# Patient Record
Sex: Male | Born: 1965 | Race: White | Hispanic: No | Marital: Married | Smoking: Current every day smoker
Health system: Southern US, Community
[De-identification: ages and names within clinical notes are randomized; demographics above are authoritative.]

## PROBLEM LIST (undated history)

## (undated) DIAGNOSIS — F209 Schizophrenia, unspecified: Secondary | ICD-10-CM

## (undated) DIAGNOSIS — F319 Bipolar disorder, unspecified: Secondary | ICD-10-CM

## (undated) DIAGNOSIS — S61202A Unspecified open wound of right middle finger without damage to nail, initial encounter: Secondary | ICD-10-CM

## (undated) DIAGNOSIS — IMO0001 Reserved for inherently not codable concepts without codable children: Secondary | ICD-10-CM

## (undated) DIAGNOSIS — J40 Bronchitis, not specified as acute or chronic: Secondary | ICD-10-CM

## (undated) DIAGNOSIS — F419 Anxiety disorder, unspecified: Secondary | ICD-10-CM

## (undated) DIAGNOSIS — Z87442 Personal history of urinary calculi: Secondary | ICD-10-CM

## (undated) DIAGNOSIS — J449 Chronic obstructive pulmonary disease, unspecified: Secondary | ICD-10-CM

## (undated) DIAGNOSIS — F32A Depression, unspecified: Secondary | ICD-10-CM

## (undated) DIAGNOSIS — F6381 Intermittent explosive disorder: Secondary | ICD-10-CM

## (undated) DIAGNOSIS — F329 Major depressive disorder, single episode, unspecified: Secondary | ICD-10-CM

## (undated) HISTORY — PX: BUNIONECTOMY: SHX129

## (undated) HISTORY — PX: VASECTOMY: SHX75

## (undated) HISTORY — PX: SHOULDER SURGERY: SHX246

---

## 2003-09-11 ENCOUNTER — Emergency Department (HOSPITAL_COMMUNITY): Admission: EM | Admit: 2003-09-11 | Discharge: 2003-09-11 | Payer: Self-pay | Admitting: Emergency Medicine

## 2004-01-23 ENCOUNTER — Emergency Department (HOSPITAL_COMMUNITY): Admission: EM | Admit: 2004-01-23 | Discharge: 2004-01-23 | Payer: Self-pay | Admitting: Emergency Medicine

## 2004-07-30 ENCOUNTER — Ambulatory Visit (HOSPITAL_BASED_OUTPATIENT_CLINIC_OR_DEPARTMENT_OTHER): Admission: RE | Admit: 2004-07-30 | Discharge: 2004-07-30 | Payer: Self-pay | Admitting: Orthopedic Surgery

## 2008-02-01 ENCOUNTER — Emergency Department (HOSPITAL_COMMUNITY): Admission: EM | Admit: 2008-02-01 | Discharge: 2008-02-02 | Payer: Self-pay | Admitting: Emergency Medicine

## 2009-02-16 ENCOUNTER — Emergency Department (HOSPITAL_COMMUNITY): Admission: EM | Admit: 2009-02-16 | Discharge: 2009-02-17 | Payer: Self-pay | Admitting: Emergency Medicine

## 2009-10-04 ENCOUNTER — Emergency Department (HOSPITAL_COMMUNITY): Admission: EM | Admit: 2009-10-04 | Discharge: 2009-10-04 | Payer: Self-pay | Admitting: Emergency Medicine

## 2010-01-30 ENCOUNTER — Emergency Department (HOSPITAL_COMMUNITY): Admission: EM | Admit: 2010-01-30 | Discharge: 2010-01-30 | Payer: Self-pay | Admitting: Emergency Medicine

## 2010-01-30 IMAGING — CR DG ELBOW COMPLETE 3+V*R*
4 series · 4 of 4 positions shown · non-contrast
Comparison: None.

CLINICAL DATA: Right elbow pain and tingling

RIGHT ELBOW - COMPLETE 3+ VIEW

[view not recorded (1 of 4)]
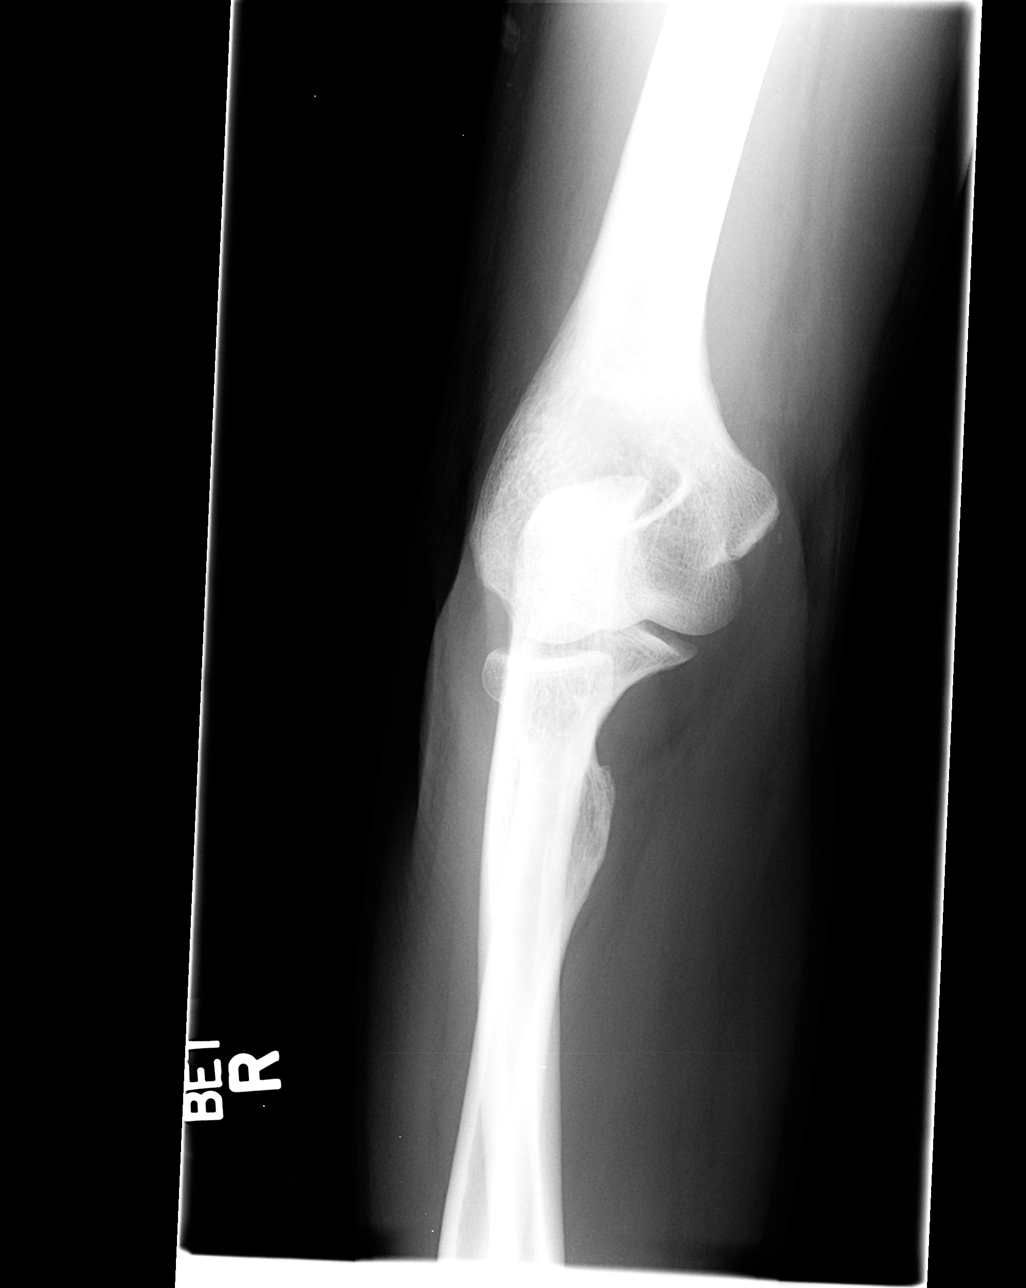

[view not recorded (2 of 4)]
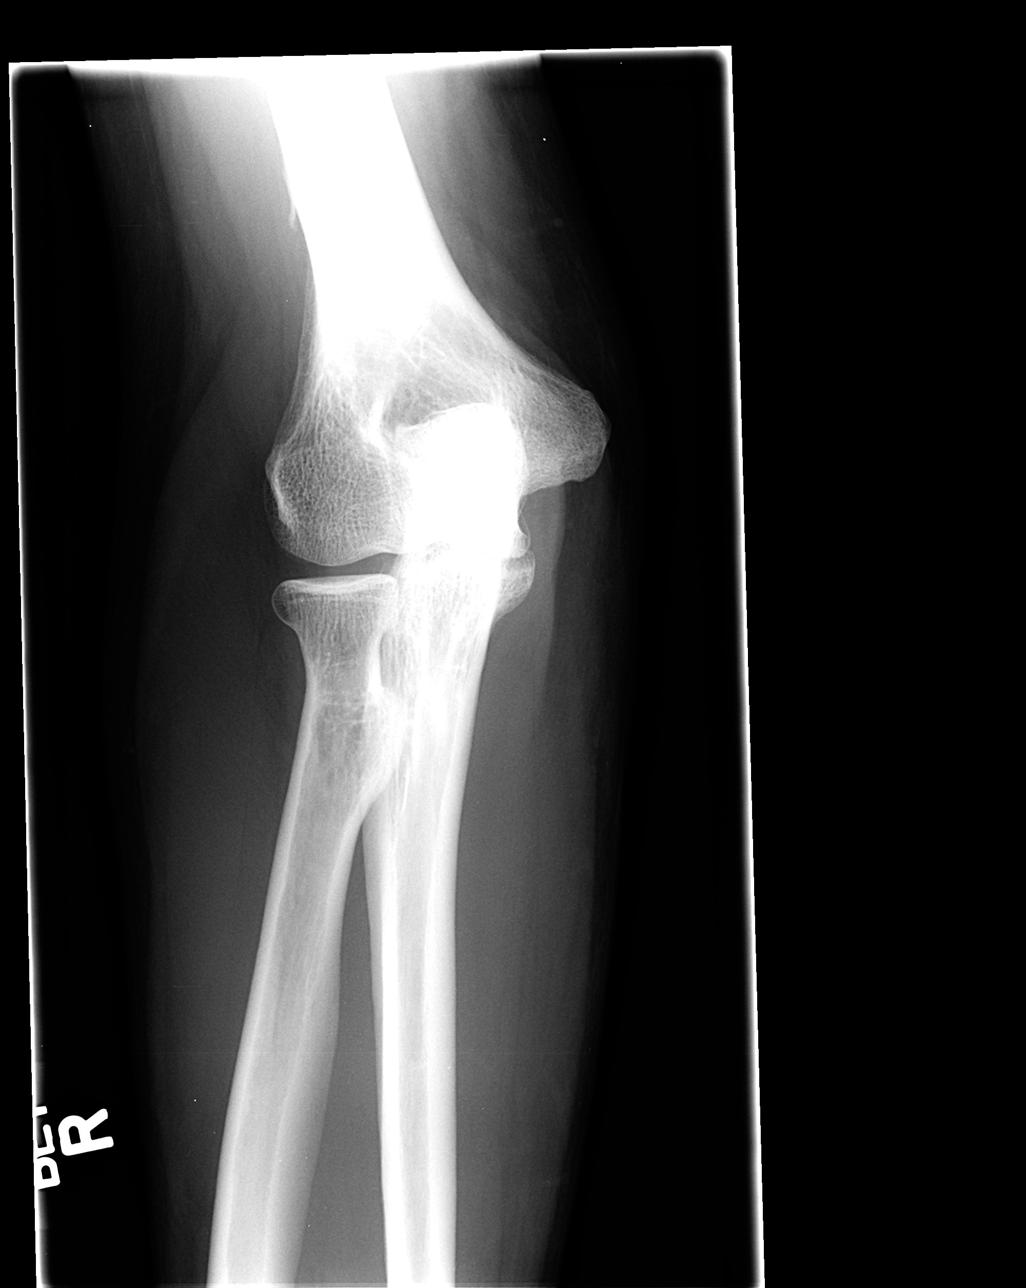

[view not recorded (3 of 4)]
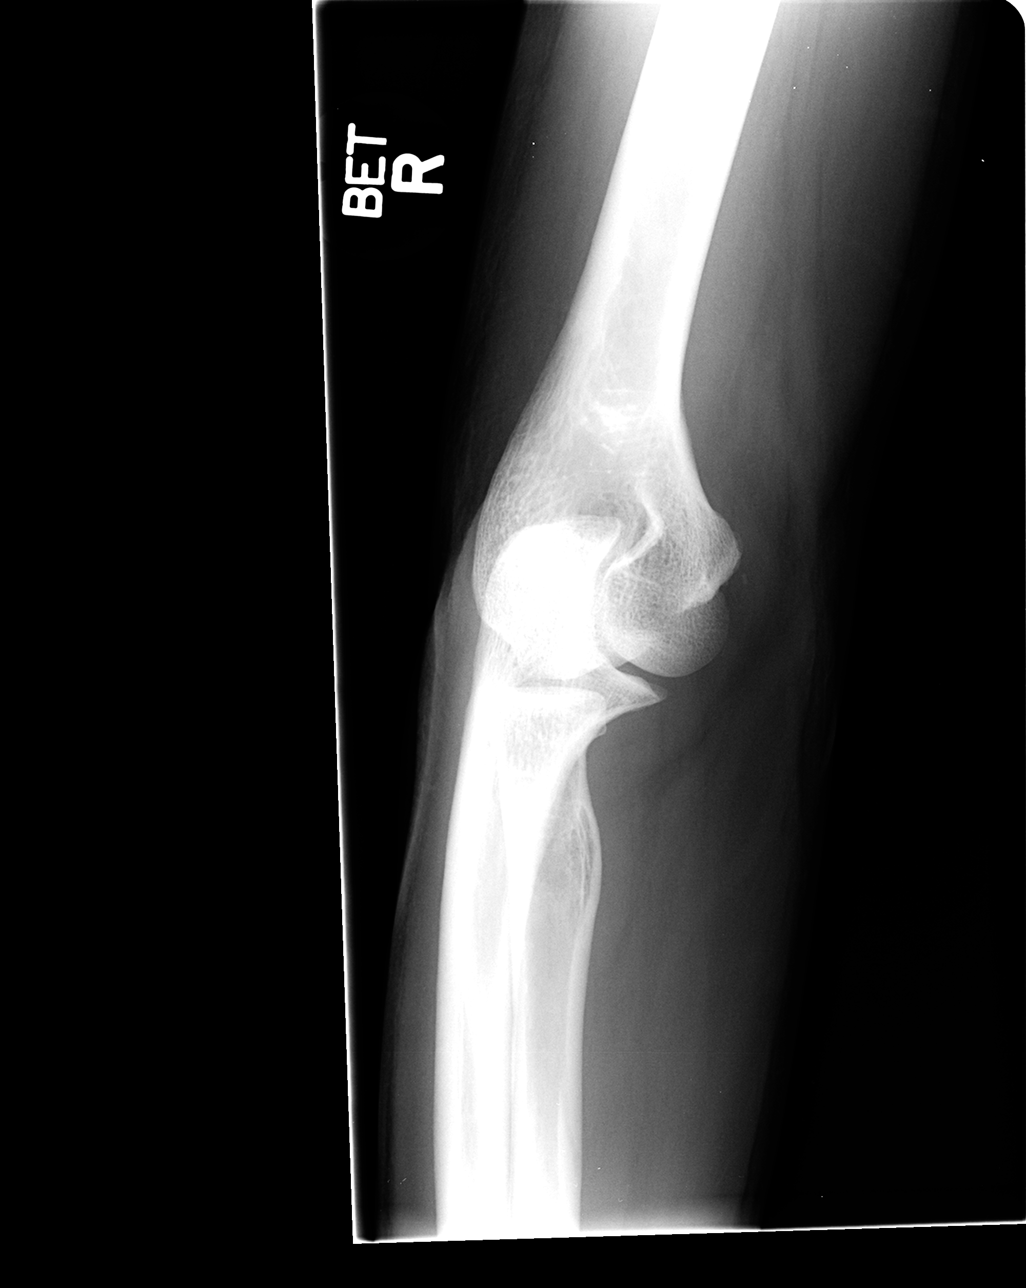

[view not recorded (4 of 4)]
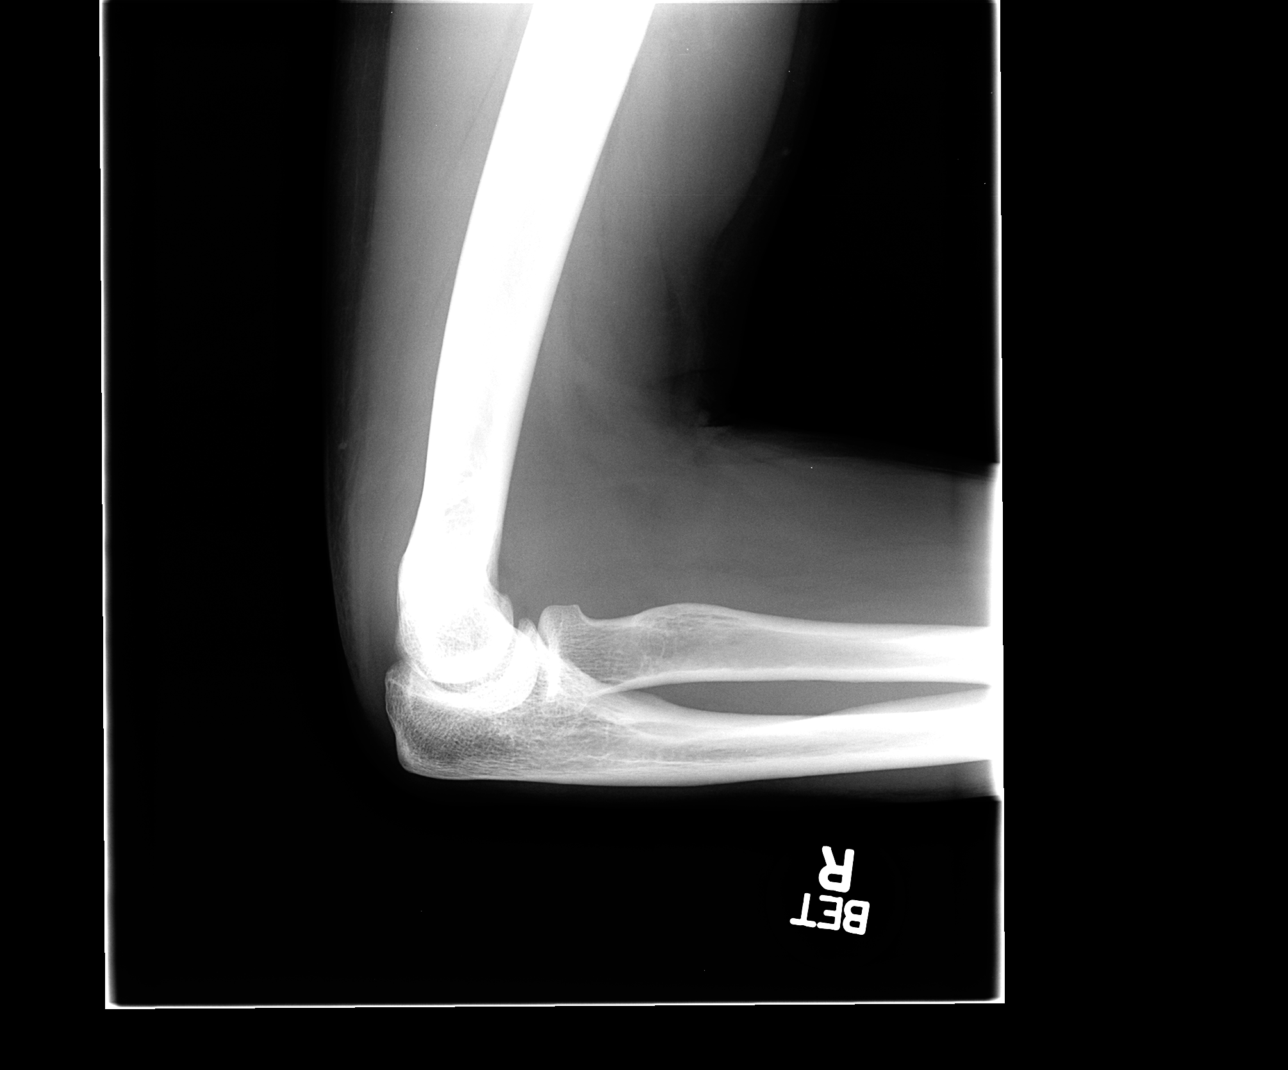

[4 of 4 positions shown; findings below may reference images not displayed]

FINDINGS: No fracture or dislocation.  No soft tissue abnormality.
No radiopaque foreign body.
IMPRESSION: Normal exam.

## 2010-03-17 ENCOUNTER — Emergency Department (HOSPITAL_COMMUNITY): Admission: EM | Admit: 2010-03-17 | Discharge: 2010-03-18 | Payer: Self-pay | Admitting: Emergency Medicine

## 2010-04-08 ENCOUNTER — Emergency Department (HOSPITAL_COMMUNITY): Admission: EM | Admit: 2010-04-08 | Discharge: 2010-04-08 | Payer: Self-pay | Admitting: Emergency Medicine

## 2010-09-20 ENCOUNTER — Emergency Department (HOSPITAL_COMMUNITY)
Admission: EM | Admit: 2010-09-20 | Discharge: 2010-09-20 | Disposition: A | Discharge status: Home / Self Care | Payer: Self-pay | Attending: Emergency Medicine | Admitting: Emergency Medicine

## 2010-09-20 DIAGNOSIS — J449 Chronic obstructive pulmonary disease, unspecified: Secondary | ICD-10-CM | POA: Insufficient documentation

## 2010-09-20 DIAGNOSIS — F209 Schizophrenia, unspecified: Secondary | ICD-10-CM | POA: Insufficient documentation

## 2010-09-20 DIAGNOSIS — M545 Low back pain, unspecified: Secondary | ICD-10-CM | POA: Insufficient documentation

## 2010-09-20 DIAGNOSIS — R296 Repeated falls: Secondary | ICD-10-CM | POA: Insufficient documentation

## 2010-09-20 DIAGNOSIS — IMO0001 Reserved for inherently not codable concepts without codable children: Secondary | ICD-10-CM | POA: Insufficient documentation

## 2010-09-20 DIAGNOSIS — S335XXA Sprain of ligaments of lumbar spine, initial encounter: Secondary | ICD-10-CM | POA: Insufficient documentation

## 2010-09-20 DIAGNOSIS — F319 Bipolar disorder, unspecified: Secondary | ICD-10-CM | POA: Insufficient documentation

## 2010-09-20 DIAGNOSIS — IMO0002 Reserved for concepts with insufficient information to code with codable children: Secondary | ICD-10-CM | POA: Insufficient documentation

## 2010-09-20 DIAGNOSIS — Y92009 Unspecified place in unspecified non-institutional (private) residence as the place of occurrence of the external cause: Secondary | ICD-10-CM | POA: Insufficient documentation

## 2010-09-20 DIAGNOSIS — J4489 Other specified chronic obstructive pulmonary disease: Secondary | ICD-10-CM | POA: Insufficient documentation

## 2010-10-07 LAB — BASIC METABOLIC PANEL
BUN: 15 mg/dL (ref 6–23)
Chloride: 109 mEq/L (ref 96–112)
Glucose, Bld: 98 mg/dL (ref 70–99)
Potassium: 3.4 mEq/L — ABNORMAL LOW (ref 3.5–5.1)

## 2010-10-07 LAB — DIFFERENTIAL
Basophils Absolute: 0.1 10*3/uL (ref 0.0–0.1)
Eosinophils Absolute: 0.4 10*3/uL (ref 0.0–0.7)
Eosinophils Relative: 4 % (ref 0–5)
Lymphs Abs: 2 10*3/uL (ref 0.7–4.0)

## 2010-10-07 LAB — CK TOTAL AND CKMB (NOT AT ARMC): Total CK: 126 U/L (ref 7–232)

## 2010-10-07 LAB — CBC
HCT: 36.8 % — ABNORMAL LOW (ref 39.0–52.0)
MCV: 89.3 fL (ref 78.0–100.0)
Platelets: 221 10*3/uL (ref 150–400)
RDW: 13.6 % (ref 11.5–15.5)
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
## 2011-04-12 LAB — URINALYSIS, ROUTINE W REFLEX MICROSCOPIC
Glucose, UA: NEGATIVE
Ketones, ur: NEGATIVE
Leukocytes, UA: NEGATIVE
Specific Gravity, Urine: 1.025
pH: 5.5

## 2011-04-12 LAB — URINE MICROSCOPIC-ADD ON

## 2011-06-06 ENCOUNTER — Encounter: Payer: Self-pay | Admitting: Emergency Medicine

## 2011-06-06 DIAGNOSIS — M79609 Pain in unspecified limb: Secondary | ICD-10-CM | POA: Insufficient documentation

## 2011-06-06 DIAGNOSIS — S90129A Contusion of unspecified lesser toe(s) without damage to nail, initial encounter: Secondary | ICD-10-CM | POA: Insufficient documentation

## 2011-06-06 DIAGNOSIS — M7989 Other specified soft tissue disorders: Secondary | ICD-10-CM | POA: Insufficient documentation

## 2011-06-06 DIAGNOSIS — Y92009 Unspecified place in unspecified non-institutional (private) residence as the place of occurrence of the external cause: Secondary | ICD-10-CM | POA: Insufficient documentation

## 2011-06-06 DIAGNOSIS — W2203XA Walked into furniture, initial encounter: Secondary | ICD-10-CM | POA: Insufficient documentation

## 2011-06-06 NOTE — ED Notes (Signed)
Patient states was walking in the kitchen and hit his left little toe on a chair.  States toe is now bruised and swollen.

## 2011-06-07 ENCOUNTER — Emergency Department (HOSPITAL_COMMUNITY)
Admit: 2011-06-07 | Discharge: 2011-06-07 | Disposition: A | Discharge status: Home / Self Care | Payer: Self-pay | Attending: Emergency Medicine | Admitting: Emergency Medicine

## 2011-06-07 ENCOUNTER — Emergency Department (HOSPITAL_COMMUNITY)
Admission: EM | Admit: 2011-06-07 | Discharge: 2011-06-07 | Disposition: A | Discharge status: Home / Self Care | Payer: Self-pay | Attending: Emergency Medicine | Admitting: Emergency Medicine

## 2011-06-07 DIAGNOSIS — S90122A Contusion of left lesser toe(s) without damage to nail, initial encounter: Secondary | ICD-10-CM

## 2011-06-07 HISTORY — DX: Schizophrenia, unspecified: F20.9

## 2011-06-07 HISTORY — DX: Depression, unspecified: F32.A

## 2011-06-07 HISTORY — DX: Major depressive disorder, single episode, unspecified: F32.9

## 2011-06-07 HISTORY — DX: Bipolar disorder, unspecified: F31.9

## 2011-06-07 HISTORY — DX: Intermittent explosive disorder: F63.81

## 2011-06-07 MED ORDER — OXYCODONE-ACETAMINOPHEN 5-325 MG PO TABS
2.0000 | ORAL_TABLET | Freq: Once | ORAL | Status: AC
  Administered 2011-06-07: 2 via ORAL
  Filled 2011-06-07: qty 2

## 2011-06-07 MED ORDER — OXYCODONE-ACETAMINOPHEN 5-325 MG PO TABS: 1.0000 | ORAL_TABLET | ORAL | Status: DC | PRN

## 2011-06-07 NOTE — ED Provider Notes (Signed)
History     CSN: [id number] Arrival date & time: 06/07/2011 12:45 AM   First MD Initiated Contact with Patient 06/07/11 [phone]      Chief Complaint  Patient presents with  . Toe Pain    (Consider location/radiation/quality/duration/timing/severity/associated sxs/prior treatment) HPI Comments: Approximately 2 hours prior to arrival the patient struck his left small toe on a chair while walking. The pain was acute in onset, constant, associated with bruising and swelling.  Patient is a 45 y.o. male presenting with toe pain. The history is provided by the patient.  Toe Pain This is a new problem. The current episode started 1 to 2 hours ago. The problem occurs constantly. The problem has not changed since onset.Exacerbated by: Walking, palpation. Relieved by: Rest. He has tried nothing for the symptoms.    Past Medical History  Diagnosis Date  . Bipolar 1 disorder   . Intermittent explosive disorder   . Schizophrenia   . Depression     Past Surgical History  Procedure Date  . Bunionectomy   . Shoulder surgery     No family history on file.  History  Substance Use Topics  . Smoking status: Current Everyday Smoker -- 2.0 packs/day    Types: Cigarettes  . Smokeless tobacco: Not on file  . Alcohol Use: No      Review of Systems  Musculoskeletal: Positive for joint swelling.  Skin:       Bruising  Neurological: Negative for numbness.  Hematological: Does not bruise/bleed easily.    Allergies  Aspirin; Ibuprofen; and Tramadol  Home Medications   Current Outpatient Rx  Name Route Sig Dispense Refill  . ALPRAZOLAM 0.5 MG PO TABS Oral Take 0.5 mg by mouth 4 (four) times daily as needed.      Marland Kitchen DIVALPROEX SODIUM 500 MG PO TB24 Oral Take 1,500 mg by mouth daily.      . SERTRALINE HCL 50 MG PO TABS Oral Take 50 mg by mouth daily.      . TRAZODONE HCL 100 MG PO TABS Oral Take 100 mg by mouth at bedtime.      . OXYCODONE-ACETAMINOPHEN 5-325 MG PO TABS Oral Take 1  tablet by mouth every 4 (four) hours as needed for pain. May take 2 tablets PO q 6 hours for severe pain - Do not take with Tylenol as this tablet already contains tylenol 10 tablet 0    BP 127/81  Pulse 67  Temp(Src) 98.4 F (36.9 C) (Oral)  Resp 18  Ht 5\' 11"  (1.803 m)  Wt 185 lb (83.915 kg)  BMI 25.80 kg/m2  SpO2 99%  Physical Exam  Nursing note and vitals reviewed. Constitutional: He appears well-developed and well-nourished. No distress.  HENT:  Head: Normocephalic and atraumatic.  Eyes: Conjunctivae are normal. No scleral icterus.  Cardiovascular: Normal rate, regular rhythm and intact distal pulses.   Pulmonary/Chest: Effort normal and breath sounds normal.  Musculoskeletal: He exhibits tenderness ( Tenderness over the left small toe with associated bruising and swelling. Minimal tenderness over the distal lateral metatarsal on the fifth on the left). He exhibits no edema.  Neurological: He is alert.  Skin: Skin is warm and dry. He is not diaphoretic.       Bruising as described above    ED Course  Procedures (including critical care time)  Labs Reviewed - No data to display Dg Toe 5th Left  06/07/2011  *RADIOLOGY REPORT*  Clinical Data: Injured left fifth toe.  LEFT 5th TOE - 2+  VIEW 06/07/2011:  Comparison: None.  Findings: Fusion of the middle and distal phalanges.  Slight plantar subluxation of the IP joint.  No evidence of acute fracture.  IMPRESSION: No evidence of acute fracture.  Fusion of the middle and distal phalanges.  Slight plantar subluxation of the IP joint.  Original Report Authenticated By: Arnell Sieving, M.D.     1. Contusion of fifth toe, left       MDM  X-ray pending, patient has possible fracture of small toe versus contusion. Not on anticoagulation. Pain medication ordered  X-ray negative, vital signs normal, rice therapy encouraged, Buddy tape given in the emergency department along with Percocet for pain. We'll discharge  home      Vida Roller, MD 06/07/11 [phone]

## 2011-06-11 ENCOUNTER — Emergency Department (HOSPITAL_COMMUNITY): Payer: Self-pay

## 2011-06-11 ENCOUNTER — Encounter (HOSPITAL_COMMUNITY): Payer: Self-pay | Admitting: Emergency Medicine

## 2011-06-11 ENCOUNTER — Emergency Department (HOSPITAL_COMMUNITY)
Admission: EM | Admit: 2011-06-11 | Discharge: 2011-06-11 | Disposition: A | Discharge status: Home / Self Care | Payer: Self-pay | Attending: Emergency Medicine | Admitting: Emergency Medicine

## 2011-06-11 DIAGNOSIS — IMO0002 Reserved for concepts with insufficient information to code with codable children: Secondary | ICD-10-CM | POA: Insufficient documentation

## 2011-06-11 DIAGNOSIS — S139XXA Sprain of joints and ligaments of unspecified parts of neck, initial encounter: Secondary | ICD-10-CM | POA: Insufficient documentation

## 2011-06-11 DIAGNOSIS — S43409A Unspecified sprain of unspecified shoulder joint, initial encounter: Secondary | ICD-10-CM

## 2011-06-11 DIAGNOSIS — Z8659 Personal history of other mental and behavioral disorders: Secondary | ICD-10-CM | POA: Insufficient documentation

## 2011-06-11 DIAGNOSIS — S161XXA Strain of muscle, fascia and tendon at neck level, initial encounter: Secondary | ICD-10-CM

## 2011-06-11 DIAGNOSIS — X500XXA Overexertion from strenuous movement or load, initial encounter: Secondary | ICD-10-CM | POA: Insufficient documentation

## 2011-06-11 DIAGNOSIS — M25519 Pain in unspecified shoulder: Secondary | ICD-10-CM | POA: Insufficient documentation

## 2011-06-11 DIAGNOSIS — M542 Cervicalgia: Secondary | ICD-10-CM | POA: Insufficient documentation

## 2011-06-11 DIAGNOSIS — Z79899 Other long term (current) drug therapy: Secondary | ICD-10-CM | POA: Insufficient documentation

## 2011-06-11 DIAGNOSIS — F3130 Bipolar disorder, current episode depressed, mild or moderate severity, unspecified: Secondary | ICD-10-CM | POA: Insufficient documentation

## 2011-06-11 HISTORY — DX: Bronchitis, not specified as acute or chronic: J40

## 2011-06-11 MED ORDER — HYDROCODONE-ACETAMINOPHEN 5-325 MG PO TABS: 1.0000 | ORAL_TABLET | ORAL | Status: AC | PRN

## 2011-06-11 NOTE — ED Notes (Signed)
PT. REPORTS INJURED RIGHT SHOULDER WITH PAIN AFTER PICKING UP A HEAVY BOX THIS EVENING.

## 2011-06-12 NOTE — ED Provider Notes (Signed)
Evaluation and management procedures were performed by the PA/NP under my supervision/collaboration.   Gaetan Spieker, MD 06/12/11 2257 

## 2011-06-12 NOTE — ED Provider Notes (Signed)
History     CSN: [id number] Arrival date & time: 06/11/2011  6:58 PM   First MD Initiated Contact with Patient 06/11/11 2243      Chief Complaint  Patient presents with  . Shoulder Pain    (Consider location/radiation/quality/duration/timing/severity/associated sxs/prior treatment) HPI History provided by pt.   Pt was pulling something heavy in the garage today, felt a pop in right shoulder and has had severe pain in both shoulder that radiates into right posterior neck ever since.  Has had reconstructive surgery on this shoulder in the past but has not had any problems with it since.  Denies RUE weakness/paresthesias.  Has not taken anything for pain.    Past Medical History  Diagnosis Date  . Bipolar 1 disorder   . Intermittent explosive disorder   . Schizophrenia   . Depression   . Bronchitis     Past Surgical History  Procedure Date  . Bunionectomy   . Shoulder surgery     History reviewed. No pertinent family history.  History  Substance Use Topics  . Smoking status: Current Everyday Smoker -- 2.0 packs/day    Types: Cigarettes  . Smokeless tobacco: Not on file  . Alcohol Use: No      Review of Systems  All other systems reviewed and are negative.    Allergies  Aspirin; Ibuprofen; and Tramadol  Home Medications   Current Outpatient Rx  Name Route Sig Dispense Refill  . ALPRAZOLAM 0.5 MG PO TABS Oral Take 0.5 mg by mouth 4 (four) times daily as needed. For anxiety    . DIVALPROEX SODIUM 500 MG PO TB24 Oral Take 1,500 mg by mouth daily.      . SERTRALINE HCL 50 MG PO TABS Oral Take 50 mg by mouth daily.      . TRAZODONE HCL 100 MG PO TABS Oral Take 100 mg by mouth at bedtime.      Marland Kitchen HYDROCODONE-ACETAMINOPHEN 5-325 MG PO TABS Oral Take 1 tablet by mouth every 4 (four) hours as needed for pain. 20 tablet 0    BP 124/86  Pulse 74  Temp(Src) 98.9 F (37.2 C) (Oral)  Resp 18  SpO2 98%  Physical Exam  Nursing note and vitals  reviewed. Constitutional: He is oriented to person, place, and time. He appears well-developed and well-nourished. No distress.  HENT:  Head: Normocephalic and atraumatic.  Eyes:       Normal appearance  Neck: Normal range of motion.  Musculoskeletal:       R shoulder w/out deformity.  Prominent distal clavicle compared to right w/ overlying vertical surgical scar.  Tenderness right trapezius, distal clavicle, anterior shoulder and humeral head.  Pain w/ passive shoulder flexion/abduction but seems to be exaggerated.  Pt can actively raise arm higher.  5/5 strength in both flexion and abduction and good control bringing arm back down.  Distal NV intact.   Neurological: He is alert and oriented to person, place, and time.  Psychiatric: He has a normal mood and affect. His behavior is normal.    ED Course  Procedures (including critical care time)  Labs Reviewed - No data to display Dg Shoulder Right  06/11/2011  *RADIOLOGY REPORT*  Clinical Data: Lifting injury with right shoulder pain.  RIGHT SHOULDER - 2+ VIEW  Comparison: 09/11/2003  Findings: No acute fracture or dislocation identified.  In the interval since the prior film in 2005, the patient has likely had some type of surgical procedure with partial resection of the distal  clavicle.  No surgical hardware identified.  Soft tissues are unremarkable.  IMPRESSION: No acute findings.  Interval partial resection of the distal clavicle.  Original Report Authenticated By: Reola Calkins, M.D.     1. Shoulder sprain   2. Cervical strain       MDM  Pt presents w/ right shoulder pain.  Xray neg for fx/dislocation.  Exam not consistent w/ rotator cuff syndrome.  Likely a sprain. Results discussed w/ pt.  Ortho tech placed in shoulder sling and pt discharged home w/ pain medication as well as referral to ortho for persistent sx.         Otilio Miu, [place] 06/12/11 1133

## 2011-10-23 ENCOUNTER — Emergency Department (HOSPITAL_COMMUNITY): Payer: Self-pay

## 2011-10-23 ENCOUNTER — Emergency Department (HOSPITAL_COMMUNITY)
Admission: EM | Admit: 2011-10-23 | Discharge: 2011-10-23 | Disposition: A | Discharge status: Home / Self Care | Payer: Self-pay | Attending: Emergency Medicine | Admitting: Emergency Medicine

## 2011-10-23 ENCOUNTER — Encounter (HOSPITAL_COMMUNITY): Payer: Self-pay | Admitting: *Deleted

## 2011-10-23 DIAGNOSIS — L03019 Cellulitis of unspecified finger: Secondary | ICD-10-CM | POA: Insufficient documentation

## 2011-10-23 DIAGNOSIS — T148XXA Other injury of unspecified body region, initial encounter: Secondary | ICD-10-CM

## 2011-10-23 DIAGNOSIS — L089 Local infection of the skin and subcutaneous tissue, unspecified: Secondary | ICD-10-CM

## 2011-10-23 DIAGNOSIS — F209 Schizophrenia, unspecified: Secondary | ICD-10-CM | POA: Insufficient documentation

## 2011-10-23 DIAGNOSIS — F17200 Nicotine dependence, unspecified, uncomplicated: Secondary | ICD-10-CM | POA: Insufficient documentation

## 2011-10-23 DIAGNOSIS — IMO0001 Reserved for inherently not codable concepts without codable children: Secondary | ICD-10-CM

## 2011-10-23 DIAGNOSIS — L02519 Cutaneous abscess of unspecified hand: Secondary | ICD-10-CM | POA: Insufficient documentation

## 2011-10-23 DIAGNOSIS — F319 Bipolar disorder, unspecified: Secondary | ICD-10-CM | POA: Insufficient documentation

## 2011-10-23 LAB — DIFFERENTIAL
Lymphocytes Relative: 35 % (ref 12–46)
Monocytes Absolute: 0.4 10*3/uL (ref 0.1–1.0)
Monocytes Relative: 6 % (ref 3–12)
Neutro Abs: 3.1 10*3/uL (ref 1.7–7.7)

## 2011-10-23 LAB — BASIC METABOLIC PANEL
BUN: 16 mg/dL (ref 6–23)
CO2: 26 mEq/L (ref 19–32)
Chloride: 106 mEq/L (ref 96–112)
GFR calc Af Amer: >90 mL/min (ref >90)
Potassium: 4 mEq/L (ref 3.5–5.1)

## 2011-10-23 LAB — CBC
HCT: 37.5 % — ABNORMAL LOW (ref 39.0–52.0)
Hemoglobin: 12.9 g/dL — ABNORMAL LOW (ref 13.0–17.0)
MCV: 94.5 fL (ref 78.0–100.0)
WBC: 5.8 10*3/uL (ref 4.0–10.5)

## 2011-10-23 MED ORDER — VANCOMYCIN HCL IN DEXTROSE 1-5 GM/200ML-% IV SOLN
1000.0000 mg | Freq: Once | INTRAVENOUS | Status: AC
  Administered 2011-10-23: 1000 mg via INTRAVENOUS
  Filled 2011-10-23: qty 200

## 2011-10-23 MED ORDER — SODIUM CHLORIDE 0.9 % IV SOLN
Freq: Once | INTRAVENOUS | Status: AC
  Administered 2011-10-23: 21:00:00 via INTRAVENOUS

## 2011-10-23 MED ORDER — TETANUS-DIPHTH-ACELL PERTUSSIS 5-2.5-18.5 LF-MCG/0.5 IM SUSP: 0.5000 mL | Freq: Once | INTRAMUSCULAR | Status: DC

## 2011-10-23 MED ORDER — LIDOCAINE HCL (PF) 1 % IJ SOLN
INTRAMUSCULAR | Status: AC
  Administered 2011-10-23: 22:00:00
  Filled 2011-10-23: qty 5

## 2011-10-23 MED ORDER — AMOXICILLIN-POT CLAVULANATE 875-125 MG PO TABS: 1.0000 | ORAL_TABLET | Freq: Two times a day (BID) | ORAL | Status: DC

## 2011-10-23 MED ORDER — OXYCODONE-ACETAMINOPHEN 5-325 MG PO TABS: 1.0000 | ORAL_TABLET | ORAL | Status: DC | PRN

## 2011-10-23 MED ORDER — HYDROMORPHONE HCL PF 1 MG/ML IJ SOLN
1.0000 mg | Freq: Once | INTRAMUSCULAR | Status: AC
  Administered 2011-10-23: 1 mg via INTRAVENOUS
  Filled 2011-10-23: qty 1

## 2011-10-23 NOTE — ED Notes (Signed)
edp notified pt is up to date on tetanus

## 2011-10-23 NOTE — ED Provider Notes (Signed)
History     CSN: [id number]  Arrival date & time 10/23/11  1649   First MD Initiated Contact with Patient 10/23/11 1923      Chief Complaint  Patient presents with  . Cellulitis     Patient is a 46 y.o. male presenting with skin laceration. The history is provided by the patient.  Laceration  The incident occurred more than 2 days ago. Pain location: right middle finger. The laceration mechanism was a a metal edge. The pain is moderate. The pain has been constant since onset. His tetanus status is unknown.  nothing improves pain Palpation worsens pain  Pt reports he cut his right middle finger on lawnmower blade 3 days ago.  He did not get evaluated at that time Since the area has become painful/swollen No fever/vomiting No other complaints/injury reported  Past Medical History  Diagnosis Date  . Bipolar 1 disorder   . Intermittent explosive disorder   . Schizophrenia   . Depression   . Bronchitis     Past Surgical History  Procedure Date  . Bunionectomy   . Shoulder surgery     No family history on file.  History  Substance Use Topics  . Smoking status: Current Everyday Smoker -- 2.0 packs/day    Types: Cigarettes  . Smokeless tobacco: Not on file  . Alcohol Use: No      Review of Systems  All other systems reviewed and are negative.    Allergies  Aspirin; Ibuprofen; and Tramadol  Home Medications   Current Outpatient Rx  Name Route Sig Dispense Refill  . ALPRAZOLAM 0.5 MG PO TABS Oral Take 0.5 mg by mouth 4 (four) times daily as needed. For anxiety    . DIVALPROEX SODIUM ER 500 MG PO TB24 Oral Take 1,500 mg by mouth daily.      . SERTRALINE HCL 50 MG PO TABS Oral Take 50 mg by mouth daily.      . TRAZODONE HCL 100 MG PO TABS Oral Take 100 mg by mouth at bedtime.        BP 113/70  Pulse 58  Temp(Src) 98 F (36.7 C) (Oral)  Resp 20  Ht 5\' 11"  (1.803 m)  Wt 175 lb (79.379 kg)  BMI 24.41 kg/m2  SpO2 100%  Physical Exam CONSTITUTIONAL:  Well developed/well nourished HEAD AND FACE: Normocephalic/atraumatic EYES: EOMI/PERRL ENMT: Mucous membranes moist NECK: supple no meningeal signs SPINE:entire spine nontender CV: S1/S2 noted, no murmurs/rubs/gallops noted LUNGS: Lungs are clear to auscultation bilaterally, no apparent distress ABDOMEN: soft, nontender, no rebound or guarding GU:no cva tenderness NEURO: Pt is awake/alert, moves all extremitiesx4 EXTREMITIES: right middle finger - healed laceration to dorsal aspect of finger proximal to PIP.  There is associated erythema with edema and tenderness with palpation and passive ROM.  He is unable to fully flex/extend due to swelling/pain SKIN: warm, color normal PSYCH: no abnormalities of mood noted  ED Course  Procedures    Labs Reviewed  BASIC METABOLIC PANEL  CBC  DIFFERENTIAL   7:57 PM Suspect that his laceration has become infected since this occurred >3 days ago.  Concern for tenosynovitis 8:31 PM D/w dr Melvyn Novas, on for hand, he requests I call ortho locally here in [place] 8:36 PM Spoke to dr Romeo Apple, will see patient in the ED  Dr Romeo Apple performed I&D, advised augmentin and to see ortho hand within 48 hours Pt agreeable  MDM  Nursing notes reviewed and considered in documentation All labs/vitals reviewed and considered xrays  reviewed and considered         Joya Gaskins, MD 10/23/11 [phone]

## 2011-10-23 NOTE — Consult Note (Signed)
Reason for Consult:LACERATION RIGHT LONG FINGER HAND SURGEON DR Jonell Cluck , I WAS ASKED TO EVALUATE  Referring Physician: DR Lynnell Catalan Luke Craig is an 46 y.o. male.  HPI: CUT HAND 3 DAYS AGO SELF TREATEDPAIN SWELLING INCREASED STARTED TO GET RED TENDER AND SWOLLEN   XRAYS NORMAL   Past Medical History  Diagnosis Date  . Bipolar 1 disorder   . Intermittent explosive disorder   . Schizophrenia   . Depression   . Bronchitis     Past Surgical History  Procedure Date  . Bunionectomy   . Shoulder surgery     History reviewed. No pertinent family history.  Social History:  reports that he has been smoking Cigarettes.  He has been smoking about 2 packs per day. He does not have any smokeless tobacco history on file. He reports that he does not drink alcohol or use illicit drugs.  Allergies:  Allergies  Allergen Reactions  . Aspirin Other (See Comments)    Upset stomach  . Ibuprofen     Stomach upset  . Tramadol Nausea And Vomiting    Medications: I have reviewed the patient's current medications.  Results for orders placed during the hospital encounter of 10/23/11 (from the past 48 hour(s))  BASIC METABOLIC PANEL     Status: Normal   Collection Time   10/23/11  8:29 PM      Component Value Range Comment   Sodium 139  135 - 145 (mEq/L)    Potassium 4.0  3.5 - 5.1 (mEq/L)    Chloride 106  96 - 112 (mEq/L)    CO2 26  19 - 32 (mEq/L)    Glucose, Bld 90  70 - 99 (mg/dL)    BUN 16  6 - 23 (mg/dL)    Creatinine, Ser 4.09  0.50 - 1.35 (mg/dL)    Calcium 9.1  8.4 - 10.5 (mg/dL)    GFR calc non Af Amer >90  >90 (mL/min)    GFR calc Af Amer >90  >90 (mL/min)   CBC     Status: Abnormal   Collection Time   10/23/11  8:29 PM      Component Value Range Comment   WBC 5.8  4.0 - 10.5 (K/uL)    RBC 3.97 (*) 4.22 - 5.81 (MIL/uL)    Hemoglobin 12.9 (*) 13.0 - 17.0 (g/dL)    HCT 81.1 (*) 91.4 - 52.0 (%)    MCV 94.5  78.0 - 100.0 (fL)    MCH 32.5  26.0 - 34.0 (pg)    MCHC  34.4  30.0 - 36.0 (g/dL)    RDW 78.2  95.6 - 21.3 (%)    Platelets 167  150 - 400 (K/uL)   DIFFERENTIAL     Status: Normal   Collection Time   10/23/11  8:29 PM      Component Value Range Comment   Neutrophils Relative 54  43 - 77 (%)    Neutro Abs 3.1  1.7 - 7.7 (K/uL)    Lymphocytes Relative 35  12 - 46 (%)    Lymphs Abs 2.0  0.7 - 4.0 (K/uL)    Monocytes Relative 6  3 - 12 (%)    Monocytes Absolute 0.4  0.1 - 1.0 (K/uL)    Eosinophils Relative 4  0 - 5 (%)    Eosinophils Absolute 0.2  0.0 - 0.7 (K/uL)    Basophils Relative 1  0 - 1 (%)    Basophils Absolute 0.0  0.0 - 0.1 (K/uL)     Dg Finger Middle Right  10/23/2011  *RADIOLOGY REPORT*  Clinical Data: Soft tissue injury.  Swelling.  RIGHT MIDDLE FINGER 2+V  Comparison: None.  Findings: There is dorsal soft tissue swelling.  No evidence of fracture.  No radiopaque foreign object.  IMPRESSION: Dorsal soft tissue swelling.  Original Report Authenticated By: Thomasenia Sales, M.D.    Review of Systems  Musculoskeletal: Positive for joint pain.  Skin:       Laceration right long finger distal to PIP joint   All other systems reviewed and are negative.   Blood pressure 113/70, pulse 58, temperature 98 F (36.7 C), temperature source Oral, resp. rate 20, height 5\' 11"  (1.803 m), weight 79.379 kg (175 lb), SpO2 100.00%. Physical Exam  Constitutional: He is oriented to person, place, and time. He appears well-developed and well-nourished. No distress.  HENT:  Head: Normocephalic and atraumatic.  Eyes: Pupils are equal, round, and reactive to light.  Neck: Normal range of motion.  Cardiovascular: Normal rate.   Respiratory: Effort normal.  Musculoskeletal:       Arms:      Vital signs are stable as recorded  The patient's mood and affect are normal  Gait assessment: N/A The cardiovascular exam reveals normal pulses and temperature without edema swelling.  The lymphatic system is negative for palpable lymph nodes  The sensory  exam is normal.  There are no pathologic reflexes.  Balance is normal.   Exam of the RIGHT LONG FINGER  Inspection TRANSVERSE 1 CM LAC DISTAL TO PIP JOINT, NORMAL EXTENTION NO LAG  Range of motion PAINFUL PROM  Stability NORMAL    Neurological: He is alert and oriented to person, place, and time. He has normal reflexes.  Skin: Skin is warm and dry. He is not diaphoretic.  Psychiatric: He has a normal mood and affect. His behavior is normal. Judgment and thought content normal.    Assessment/Plan: LACERATION, SOFT TISSUE INFECTION RLF   I DID AN ASPIRATION F/B I/D  NO PURULENT MATERIAL   PACKED OPEN   F/U WILL BE WITH DR ORTON   START AUGMENTIN PERCOCET FOR PAIN   [name] 10/23/2011, 9:57 PM

## 2011-10-23 NOTE — ED Notes (Signed)
Swelling right middle finger, cut on lawnmower blade 3 days ago

## 2011-10-23 NOTE — ED Notes (Signed)
Pt alert & oriented x4, stable gait. Pt given discharge instructions, paperwork & prescription(s). Patient instructed to stop at the registration desk to finish any additional paperwork. pt verbalized understanding. Pt left department w/ no further questions.  

## 2011-10-23 NOTE — Discharge Summary (Signed)
Physician Discharge Summary  Patient ID: Luke Craig MRN: [id number] DOB/AGE: 08/30/1965 45 y.o.  Admit date: 10/23/2011 Discharge date: 10/23/2011  Admission Diagnoses:LAC RLF  Discharge Diagnoses: LAC RLF Active Problems:  Laceration of second finger, right   Discharged Condition: good  Hospital Course: IV VANC XRAYS NEG  I/D DONE - NO PURULENCE  Consults: None  Significant Diagnostic Studies: XRAY  Treatments: antibiotics: vancomycin and surgery: I/D  Discharge Exam: Blood pressure 113/70, pulse 58, temperature 98 F (36.7 C), temperature source Oral, resp. rate 20, height 5\' 11"  (1.803 m), weight 79.379 kg (175 lb), SpO2 100.00%. Incision/Wound:PACKED   Disposition: 01-Home or Self Care   Medication List  As of 10/23/2011 10:06 PM   TAKE these medications         ALPRAZolam 0.5 MG tablet   Commonly known as: XANAX   Take 0.5 mg by mouth 4 (four) times daily as needed. For anxiety      amoxicillin-clavulanate 875-125 MG per tablet   Commonly known as: AUGMENTIN   Take 1 tablet by mouth 2 (two) times daily.      divalproex 500 MG 24 hr tablet   Commonly known as: DEPAKOTE ER   Take 1,500 mg by mouth daily. At 6pm      oxyCODONE-acetaminophen 5-325 MG per tablet   Commonly known as: PERCOCET   Take 1 tablet by mouth every 4 (four) hours as needed for pain.      sertraline 50 MG tablet   Commonly known as: ZOLOFT   Take 50 mg by mouth every morning.      traZODone 100 MG tablet   Commonly known as: DESYREL   Take 100 mg by mouth at bedtime as needed. For sleep           Follow-up Information    Follow up with Edd Arbour, MD. Schedule an appointment as soon as possible for a visit in 2 days. (CALL OFFICE TOMORROW MAKE APPT )    Contact information:   [address] [place] [address] [phone]          Signed: [name] 10/23/2011, 10:06 PM

## 2011-10-23 NOTE — Discharge Instructions (Signed)
ELEVATE THE HAND

## 2011-10-23 NOTE — ED Notes (Signed)
Wound closed from 3 days ago. Swelling noted in joint.

## 2011-10-23 NOTE — ED Notes (Signed)
I&D setup completed per Dr. Mort Sawyers order.

## 2011-10-24 ENCOUNTER — Encounter (EMERGENCY_DEPARTMENT_HOSPITAL): Payer: Self-pay | Admitting: Orthopedic Surgery

## 2011-10-24 DIAGNOSIS — IMO0001 Reserved for inherently not codable concepts without codable children: Secondary | ICD-10-CM

## 2011-10-24 DIAGNOSIS — S61209A Unspecified open wound of unspecified finger without damage to nail, initial encounter: Secondary | ICD-10-CM

## 2011-10-24 NOTE — Progress Notes (Signed)
Patient ID: Luke Craig, male   DOB: 1965/10/24, 46 y.o.   MRN: [id number] Documentation of procedure RIGHT long finger  Preoperative diagnosis PIP joint infection  Postop diagnosis RIGHT long finger post laceration soft tissue infection  Procedure aspiration followed by incision and drainage RIGHT long finger  Surgeon Romeo Apple  Anesthetic 10 cc of 1% lidocaine plain  Details of procedure after informed consent was obtained the patient verbally consented to incision and drainage of his RIGHT long finger this was confirmed by timeout procedure  The RIGHT long finger was prepped digital block was placed after 7 minutes and aspiration was done of the PIP joint it was negative  An L-shaped incision was used to drain the finger blunt dissection was carried out over the extensor tendon there is no purulent material obtained.  The wound was irrigated and packed open.  Sterile dressing was applied  Patient tolerated procedure well.

## 2011-10-26 ENCOUNTER — Encounter (HOSPITAL_COMMUNITY): Payer: Self-pay | Admitting: *Deleted

## 2011-10-26 DIAGNOSIS — F17200 Nicotine dependence, unspecified, uncomplicated: Secondary | ICD-10-CM | POA: Insufficient documentation

## 2011-10-26 DIAGNOSIS — F319 Bipolar disorder, unspecified: Secondary | ICD-10-CM | POA: Insufficient documentation

## 2011-10-26 DIAGNOSIS — L02519 Cutaneous abscess of unspecified hand: Secondary | ICD-10-CM | POA: Insufficient documentation

## 2011-10-26 DIAGNOSIS — L03019 Cellulitis of unspecified finger: Secondary | ICD-10-CM | POA: Insufficient documentation

## 2011-10-26 DIAGNOSIS — F209 Schizophrenia, unspecified: Secondary | ICD-10-CM | POA: Insufficient documentation

## 2011-10-26 NOTE — ED Notes (Addendum)
Pt reports was seen on Wed for laceration to right middle finger.  Reports that he has had increased pain today. Reports out of pain medication, states that he wasn't able to make a follow up appointment until Monday.

## 2011-10-27 ENCOUNTER — Emergency Department (HOSPITAL_COMMUNITY)
Admission: EM | Admit: 2011-10-27 | Discharge: 2011-10-27 | Disposition: A | Discharge status: Home / Self Care | Payer: Self-pay | Attending: Emergency Medicine | Admitting: Emergency Medicine

## 2011-10-27 DIAGNOSIS — L0291 Cutaneous abscess, unspecified: Secondary | ICD-10-CM

## 2011-10-27 MED ORDER — BUTALBITAL-APAP-CAFFEINE 50-325-40 MG PO TABS: 1.0000 | ORAL_TABLET | Freq: Four times a day (QID) | ORAL | Status: DC | PRN

## 2011-10-27 MED ORDER — LIDOCAINE HCL (PF) 1 % IJ SOLN
INTRAMUSCULAR | Status: AC
  Administered 2011-10-27: 01:00:00
  Filled 2011-10-27: qty 5

## 2011-10-27 NOTE — Discharge Instructions (Signed)
Abscess An abscess (boil or furuncle) is an infected area that contains a collection of pus.  SYMPTOMS Signs and symptoms of an abscess include pain, tenderness, redness, or hardness. You may feel a moveable soft area under your skin. An abscess can occur anywhere in the body.  TREATMENT  A surgical cut (incision) may be made over your abscess to drain the pus. Gauze may be packed into the space or a drain may be looped through the abscess cavity (pocket). This provides a drain that will allow the cavity to heal from the inside outwards. The abscess may be painful for a few days, but should feel much better if it was drained.  Your abscess, if seen early, may not have localized and may not have been drained. If not, another appointment may be required if it does not get better on its own or with medications. HOME CARE INSTRUCTIONS   Only take over-the-counter or prescription medicines for pain, discomfort, or fever as directed by your caregiver.   Take your antibiotics as directed if they were prescribed. Finish them even if you start to feel better.   Keep the skin and clothes clean around your abscess.   If the abscess was drained, you will need to use gauze dressing to collect any draining pus. Dressings will typically need to be changed 3 or more times a day.   The infection may spread by skin contact with others. Avoid skin contact as much as possible.   Practice good hygiene. This includes regular hand washing, cover any draining skin lesions, and do not share personal care items.   If you participate in sports, do not share athletic equipment, towels, whirlpools, or personal care items. Shower after every practice or tournament.   If a draining area cannot be adequately covered:   Do not participate in sports.   Children should not participate in day care until the wound has healed or drainage stops.   If your caregiver has given you a follow-up appointment, it is very important  to keep that appointment. Not keeping the appointment could result in a much worse infection, chronic or permanent injury, pain, and disability. If there is any problem keeping the appointment, you must call back to this facility for assistance.  SEEK MEDICAL CARE IF:   You develop increased pain, swelling, redness, drainage, or bleeding in the wound site.   You develop signs of generalized infection including muscle aches, chills, fever, or a general ill feeling.   You have an oral temperature above 102 F (38.9 C).  MAKE SURE YOU:   Understand these instructions.   Will watch your condition.   Will get help right away if you are not doing well or get worse.    Keep wound clean dry and covered until evaluated by your surgeon.

## 2011-10-27 NOTE — ED Provider Notes (Addendum)
History     CSN: [id number]  Arrival date & time 10/26/11  2305   First MD Initiated Contact with Patient 10/27/11 0009      No chief complaint on file.   (Consider location/radiation/quality/duration/timing/severity/associated sxs/prior treatment) The history is provided by the patient.   left third finger laceration occurred 6 days ago. He cannot walk more. He was evaluated here on 10/23/2011 and at that time was seen by orthopedics Dr. Romeo Apple who performed I&D. Patient was started on antibiotics and is currently taking Augmentin. He is scheduled to see Dr. Orlan Leavens, hand specialist tomorrow in the clinic. He has ran out of pain medications and is requesting something for severe finger pain. No radiation of sharp pain. Patient has packing in place and states hurts too much to try to bend it. He denies any distal weakness or numbness. There is no pain in his hand or forearm. Injury occurred to the dorsal aspect over the PIP joint. He is kept bandage in place since being seen here in the emergency room by Dr. Romeo Apple. No fevers. No nausea vomiting. No other complaints.  Past Medical History  Diagnosis Date  . Bipolar 1 disorder   . Intermittent explosive disorder   . Schizophrenia   . Depression   . Bronchitis     Past Surgical History  Procedure Date  . Bunionectomy   . Shoulder surgery     History reviewed. No pertinent family history.  History  Substance Use Topics  . Smoking status: Current Everyday Smoker -- 2.0 packs/day    Types: Cigarettes  . Smokeless tobacco: Not on file  . Alcohol Use: No      Review of Systems  Constitutional: Negative for fever and chills.  HENT: Negative for neck pain and neck stiffness.   Eyes: Negative for pain.  Respiratory: Negative for shortness of breath.   Cardiovascular: Negative for chest pain.  Gastrointestinal: Negative for nausea, vomiting and abdominal pain.  Genitourinary: Negative for dysuria.  Musculoskeletal: Negative  for back pain.  Skin: Positive for wound. Negative for rash.  Neurological: Negative for headaches.  All other systems reviewed and are negative.    Allergies  Aspirin; Ibuprofen; and Tramadol  Home Medications   Current Outpatient Rx  Name Route Sig Dispense Refill  . ALPRAZOLAM 0.5 MG PO TABS Oral Take 0.5 mg by mouth 4 (four) times daily as needed. For anxiety    . AMOXICILLIN-POT CLAVULANATE 875-125 MG PO TABS Oral Take 1 tablet by mouth 2 (two) times daily. 28 tablet 1  . AMOXICILLIN-POT CLAVULANATE 875-125 MG PO TABS Oral Take 1 tablet by mouth every 12 (twelve) hours. 14 tablet 0  . DIVALPROEX SODIUM ER 500 MG PO TB24 Oral Take 1,500 mg by mouth daily. At 6pm    . OXYCODONE-ACETAMINOPHEN 5-325 MG PO TABS Oral Take 1 tablet by mouth every 4 (four) hours as needed for pain. 10 tablet 0  . OXYCODONE-ACETAMINOPHEN 5-325 MG PO TABS Oral Take 1 tablet by mouth every 4 (four) hours as needed for pain. 30 tablet 0  . SERTRALINE HCL 50 MG PO TABS Oral Take 50 mg by mouth every morning.     . TRAZODONE HCL 100 MG PO TABS Oral Take 100 mg by mouth at bedtime as needed. For sleep      BP 118/75  Pulse 77  Temp(Src) 97.7 F (36.5 C) (Oral)  Resp 18  Ht 5\' 11"  (1.803 m)  Wt 180 lb (81.647 kg)  BMI 25.10 kg/m2  Physical  Exam  Constitutional: He is oriented to person, place, and time. He appears well-developed and well-nourished.  HENT:  Head: Normocephalic and atraumatic.  Eyes: Conjunctivae and EOM are normal. Pupils are equal, round, and reactive to light.  Neck: Trachea normal. Neck supple. No thyromegaly present.  Cardiovascular: Normal rate, regular rhythm, S1 normal, S2 normal and normal pulses.     No systolic murmur is present   No diastolic murmur is present  Pulses:      Radial pulses are 2+ on the right side, and 2+ on the left side.  Pulmonary/Chest: Effort normal and breath sounds normal. He has no wheezes. He has no rhonchi. He has no rales. He exhibits no  tenderness.  Abdominal: Soft. Normal appearance and bowel sounds are normal. There is no tenderness. There is no CVA tenderness and negative Murphy's sign.  Musculoskeletal:       Right third digit with wound over the DIP joint the dorsal aspect. Packing in place. There is tenderness without significant erythema. No surrounding increased warmth to touch. No streaking lymphangitis. No tenderness over the MCP, hand or forearm. Distal cap refill and neurovascular intact. Decreased flexion secondary to pain at that joint. No significant drainage appreciated.  Neurological: He is alert and oriented to person, place, and time. He has normal strength. No cranial nerve deficit or sensory deficit. GCS eye subscore is 4. GCS verbal subscore is 5. GCS motor subscore is 6.  Skin: Skin is warm and dry. No rash noted. He is not diaphoretic.  Psychiatric: His speech is normal.       Cooperative and appropriate    ED Course  DIGITAL BLOCK Date/Time: 10/27/2011 12:25 AM Performed by: Sunnie Nielsen Authorized by: Sunnie Nielsen Consent: Verbal consent obtained. Risks and benefits: risks, benefits and alternatives were discussed Consent given by: patient Patient understanding: patient states understanding of the procedure being performed Patient consent: the patient's understanding of the procedure matches consent given Procedure consent: procedure consent matches procedure scheduled Required items: required blood products, implants, devices, and special equipment available Patient identity confirmed: verbally with patient Time out: Immediately prior to procedure a "time out" was called to verify the correct patient, procedure, equipment, support staff and site/side marked as required. Indications: pain relief Body area: upper extremity Nerve: digital Laterality: right Preparation: Patient was prepped and draped in the usual sterile fashion. Patient position: sitting Needle gauge: 27 G Location technique:  anatomical landmarks Local anesthetic: lidocaine 1% without epinephrine Anesthetic total: 2 ml Outcome: pain improved Patient tolerance: Patient tolerated the procedure well with no immediate complications.   (including critical care time)  Patient requesting something for severe pain opted for digital block as above. There is no erythema to the proximal digit or contraindication to procedure   MDM   Recheck of severe left finger pain after I&D in emergency department. Patient has antibiotics. Patient has hand followup scheduled tomorrow, Monday in clinic.    Wound appears to be healing well at this point. A sterile bandage applied. stable for discharge home with pain improved.        Sunnie Nielsen, MD 10/27/11 1610    Sunnie Nielsen, MD 10/27/11 [id number]  Sunnie Nielsen, MD 10/28/11 [phone]

## 2011-10-29 ENCOUNTER — Encounter (HOSPITAL_COMMUNITY): Payer: Self-pay | Admitting: Pharmacy Technician

## 2011-10-29 ENCOUNTER — Encounter (HOSPITAL_COMMUNITY): Payer: Self-pay | Admitting: *Deleted

## 2011-10-29 NOTE — Progress Notes (Signed)
Left message for Clydie Braun, Dr. Glenna Durand scheduler, requesting orders.

## 2011-10-30 ENCOUNTER — Encounter (HOSPITAL_COMMUNITY): Payer: Self-pay | Admitting: *Deleted

## 2011-10-30 ENCOUNTER — Ambulatory Visit (HOSPITAL_COMMUNITY)
Admission: RE | Admit: 2011-10-30 | Discharge: 2011-10-30 | Disposition: A | Discharge status: Home / Self Care | Payer: Self-pay | Source: Ambulatory Visit | Attending: Orthopedic Surgery | Admitting: Orthopedic Surgery

## 2011-10-30 ENCOUNTER — Encounter (HOSPITAL_COMMUNITY): Payer: Self-pay | Admitting: Anesthesiology

## 2011-10-30 ENCOUNTER — Ambulatory Visit (HOSPITAL_COMMUNITY): Payer: Self-pay | Admitting: Anesthesiology

## 2011-10-30 ENCOUNTER — Encounter (HOSPITAL_COMMUNITY)
Admission: RE | Disposition: A | Discharge status: Home / Self Care | Payer: Self-pay | Source: Ambulatory Visit | Attending: Orthopedic Surgery

## 2011-10-30 DIAGNOSIS — X58XXXA Exposure to other specified factors, initial encounter: Secondary | ICD-10-CM | POA: Insufficient documentation

## 2011-10-30 DIAGNOSIS — S61209A Unspecified open wound of unspecified finger without damage to nail, initial encounter: Secondary | ICD-10-CM | POA: Insufficient documentation

## 2011-10-30 DIAGNOSIS — F17200 Nicotine dependence, unspecified, uncomplicated: Secondary | ICD-10-CM | POA: Insufficient documentation

## 2011-10-30 DIAGNOSIS — F319 Bipolar disorder, unspecified: Secondary | ICD-10-CM | POA: Insufficient documentation

## 2011-10-30 DIAGNOSIS — F603 Borderline personality disorder: Secondary | ICD-10-CM | POA: Insufficient documentation

## 2011-10-30 DIAGNOSIS — F209 Schizophrenia, unspecified: Secondary | ICD-10-CM | POA: Insufficient documentation

## 2011-10-30 HISTORY — PX: I & D EXTREMITY: SHX5045

## 2011-10-30 HISTORY — DX: Personal history of urinary calculi: Z87.442

## 2011-10-30 HISTORY — DX: Unspecified open wound of right middle finger without damage to nail, initial encounter: S61.202A

## 2011-10-30 LAB — CBC
MCHC: 34.2 g/dL (ref 30.0–36.0)
Platelets: 190 10*3/uL (ref 150–400)
RDW: 13.5 % (ref 11.5–15.5)
WBC: 4.8 10*3/uL (ref 4.0–10.5)

## 2011-10-30 SURGERY — IRRIGATION AND DEBRIDEMENT EXTREMITY
Anesthesia: General | Laterality: Right | Wound class: Dirty or Infected

## 2011-10-30 MED ORDER — OXYCODONE-ACETAMINOPHEN 5-325 MG PO TABS: 1.0000 | ORAL_TABLET | ORAL | Status: AC | PRN

## 2011-10-30 MED ORDER — FENTANYL CITRATE 0.05 MG/ML IJ SOLN: 50.0000 ug | INTRAMUSCULAR | Status: DC | PRN

## 2011-10-30 MED ORDER — OXYCODONE-ACETAMINOPHEN 5-325 MG PO TABS: 1.0000 | ORAL_TABLET | ORAL | Status: DC | PRN

## 2011-10-30 MED ORDER — CEFAZOLIN SODIUM-DEXTROSE 2-3 GM-% IV SOLR
2.0000 g | INTRAVENOUS | Status: AC
  Administered 2011-10-30: 2 g via INTRAVENOUS

## 2011-10-30 MED ORDER — MUPIROCIN 2 % EX OINT
TOPICAL_OINTMENT | CUTANEOUS | Status: AC
  Administered 2011-10-30: 1 via NASAL
  Filled 2011-10-30: qty 22

## 2011-10-30 MED ORDER — CHLORHEXIDINE GLUCONATE 4 % EX LIQD: 60.0000 mL | Freq: Once | CUTANEOUS | Status: DC

## 2011-10-30 MED ORDER — LORAZEPAM 2 MG/ML IJ SOLN: 1.0000 mg | Freq: Once | INTRAMUSCULAR | Status: DC | PRN

## 2011-10-30 MED ORDER — MIDAZOLAM HCL 5 MG/5ML IJ SOLN
INTRAMUSCULAR | Status: DC | PRN
  Administered 2011-10-30: 2 mg via INTRAVENOUS

## 2011-10-30 MED ORDER — MIDAZOLAM HCL 2 MG/2ML IJ SOLN: 1.0000 mg | INTRAMUSCULAR | Status: DC | PRN

## 2011-10-30 MED ORDER — SODIUM CHLORIDE 0.9 % IR SOLN
Status: DC | PRN
  Administered 2011-10-30: 1000 mL

## 2011-10-30 MED ORDER — PROPOFOL 10 MG/ML IV BOLUS
INTRAVENOUS | Status: DC | PRN
  Administered 2011-10-30: 200 mg via INTRAVENOUS

## 2011-10-30 MED ORDER — HYDROMORPHONE HCL PF 1 MG/ML IJ SOLN
0.2500 mg | INTRAMUSCULAR | Status: DC | PRN
  Administered 2011-10-30 (×2): 0.5 mg via INTRAVENOUS

## 2011-10-30 MED ORDER — LACTATED RINGERS IV SOLN
INTRAVENOUS | Status: DC
  Administered 2011-10-30: 16:00:00 via INTRAVENOUS

## 2011-10-30 MED ORDER — MUPIROCIN 2 % EX OINT
TOPICAL_OINTMENT | Freq: Two times a day (BID) | CUTANEOUS | Status: DC
  Administered 2011-10-30: 1 via NASAL
  Filled 2011-10-30: qty 22

## 2011-10-30 MED ORDER — SUFENTANIL CITRATE 50 MCG/ML IV SOLN
INTRAVENOUS | Status: DC | PRN
  Administered 2011-10-30: 10 ug via INTRAVENOUS

## 2011-10-30 MED ORDER — LACTATED RINGERS IV SOLN
INTRAVENOUS | Status: DC | PRN
  Administered 2011-10-30: 17:00:00 via INTRAVENOUS

## 2011-10-30 SURGICAL SUPPLY — 56 items
BANDAGE CONFORM 2  STR LF (GAUZE/BANDAGES/DRESSINGS) IMPLANT
BANDAGE ELASTIC 3 VELCRO ST LF (GAUZE/BANDAGES/DRESSINGS) ×2 IMPLANT
BANDAGE ELASTIC 4 VELCRO ST LF (GAUZE/BANDAGES/DRESSINGS) ×2 IMPLANT
BANDAGE GAUZE 4  KLING STR (GAUZE/BANDAGES/DRESSINGS) ×1 IMPLANT
BANDAGE GAUZE ELAST BULKY 4 IN (GAUZE/BANDAGES/DRESSINGS) ×2 IMPLANT
BNDG CMPR 9X4 STRL LF SNTH (GAUZE/BANDAGES/DRESSINGS) ×1
BNDG COHESIVE 1X5 TAN STRL LF (GAUZE/BANDAGES/DRESSINGS) ×1 IMPLANT
BNDG ESMARK 4X9 LF (GAUZE/BANDAGES/DRESSINGS) ×2 IMPLANT
CLOTH BEACON ORANGE TIMEOUT ST (SAFETY) ×2 IMPLANT
CORDS BIPOLAR (ELECTRODE) ×2 IMPLANT
COVER SURGICAL LIGHT HANDLE (MISCELLANEOUS) ×2 IMPLANT
CUFF TOURNIQUET SINGLE 18IN (TOURNIQUET CUFF) ×2 IMPLANT
CUFF TOURNIQUET SINGLE 24IN (TOURNIQUET CUFF) IMPLANT
DRAIN PENROSE 1/4X12 LTX STRL (WOUND CARE) IMPLANT
DRAPE SURG 17X23 STRL (DRAPES) ×2 IMPLANT
DRSG ADAPTIC 3X8 NADH LF (GAUZE/BANDAGES/DRESSINGS) ×2 IMPLANT
DRSG EMULSION OIL 3X3 NADH (GAUZE/BANDAGES/DRESSINGS) ×1 IMPLANT
ELECT REM PT RETURN 9FT ADLT (ELECTROSURGICAL)
ELECTRODE REM PT RTRN 9FT ADLT (ELECTROSURGICAL) IMPLANT
GAUZE XEROFORM 1X8 LF (GAUZE/BANDAGES/DRESSINGS) ×1 IMPLANT
GAUZE XEROFORM 5X9 LF (GAUZE/BANDAGES/DRESSINGS) IMPLANT
GLOVE BIOGEL PI IND STRL 8.5 (GLOVE) ×1 IMPLANT
GLOVE BIOGEL PI INDICATOR 8.5 (GLOVE) ×1
GLOVE SURG ORTHO 8.0 STRL STRW (GLOVE) ×2 IMPLANT
GOWN PREVENTION PLUS XLARGE (GOWN DISPOSABLE) ×2 IMPLANT
GOWN STRL NON-REIN LRG LVL3 (GOWN DISPOSABLE) ×4 IMPLANT
HANDPIECE INTERPULSE COAX TIP (DISPOSABLE)
KIT BASIN OR (CUSTOM PROCEDURE TRAY) ×2 IMPLANT
KIT ROOM TURNOVER OR (KITS) ×2 IMPLANT
MANIFOLD NEPTUNE II (INSTRUMENTS) ×1 IMPLANT
NDL HYPO 25GX1X1/2 BEV (NEEDLE) IMPLANT
NEEDLE HYPO 25GX1X1/2 BEV (NEEDLE) IMPLANT
NS IRRIG 1000ML POUR BTL (IV SOLUTION) ×2 IMPLANT
PACK ORTHO EXTREMITY (CUSTOM PROCEDURE TRAY) ×2 IMPLANT
PAD ARMBOARD 7.5X6 YLW CONV (MISCELLANEOUS) ×4 IMPLANT
PAD CAST 4YDX4 CTTN HI CHSV (CAST SUPPLIES) ×1 IMPLANT
PADDING CAST COTTON 4X4 STRL (CAST SUPPLIES)
SET HNDPC FAN SPRY TIP SCT (DISPOSABLE) IMPLANT
SOAP 2 % CHG 4 OZ (WOUND CARE) ×2 IMPLANT
SPONGE GAUZE 4X4 12PLY (GAUZE/BANDAGES/DRESSINGS) ×2 IMPLANT
SPONGE LAP 18X18 X RAY DECT (DISPOSABLE) ×1 IMPLANT
SPONGE LAP 4X18 X RAY DECT (DISPOSABLE) ×2 IMPLANT
SUCTION FRAZIER TIP 10 FR DISP (SUCTIONS) ×2 IMPLANT
SUT ETHILON 4 0 PS 2 18 (SUTURE) IMPLANT
SUT ETHILON 5 0 P 3 18 (SUTURE)
SUT MNCRL AB 4-0 PS2 18 (SUTURE) ×1 IMPLANT
SUT NYLON ETHILON 5-0 P-3 1X18 (SUTURE) ×1 IMPLANT
SUT PROLENE 4 0 PS 2 18 (SUTURE) ×1 IMPLANT
SYR CONTROL 10ML LL (SYRINGE) IMPLANT
TOWEL OR 17X24 6PK STRL BLUE (TOWEL DISPOSABLE) ×2 IMPLANT
TOWEL OR 17X26 10 PK STRL BLUE (TOWEL DISPOSABLE) ×2 IMPLANT
TUBE ANAEROBIC SPECIMEN COL (MISCELLANEOUS) IMPLANT
TUBE CONNECTING 12X1/4 (SUCTIONS) ×2 IMPLANT
UNDERPAD 30X30 INCONTINENT (UNDERPADS AND DIAPERS) ×2 IMPLANT
WATER STERILE IRR 1000ML POUR (IV SOLUTION) ×1 IMPLANT
YANKAUER SUCT BULB TIP NO VENT (SUCTIONS) ×1 IMPLANT

## 2011-10-30 NOTE — Discharge Instructions (Signed)
KEEP BANDAGE CLEAN AND DRY °CALL OFFICE FOR F/U APPT [phone] in 8 days °KEEP HAND ELEVATED ABOVE HEART °OK TO APPLY ICE TO OPERATIVE AREA °CONTACT OFFICE IF ANY WORSENING PAIN OR CONCERNS. °

## 2011-10-30 NOTE — H&P (Signed)
Luke Craig is an 46 y.o. male.   Chief Complaint: Open wound right long finger HPI: See chart for details Pt seen/examined in office Pt with open wound to finger  Here for surgery to clean wound and close wound  Past Medical History  Diagnosis Date  . Bipolar 1 disorder   . Intermittent explosive disorder   . Schizophrenia   . Depression   . Bronchitis     hx  . History of kidney stones   . Open wound of right middle finger without damage to nail     Past Surgical History  Procedure Date  . Bunionectomy   . Shoulder surgery   . Vasectomy     History reviewed. No pertinent family history. Social History:  reports that he has been smoking Cigarettes.  He has been smoking about 1 pack per day. He does not have any smokeless tobacco history on file. He reports that he does not drink alcohol or use illicit drugs.  Allergies:  Allergies  Allergen Reactions  . Aspirin Other (See Comments)    Upset stomach  . Ibuprofen     Stomach upset  . Tramadol Nausea And Vomiting    Medications Prior to Admission  Medication Dose Route Frequency Provider Last Rate Last Dose  . ceFAZolin (ANCEF) IVPB 2 g/50 mL premix  2 g Intravenous 60 min Pre-Op Sharma Covert, MD      . chlorhexidine (HIBICLENS) 4 % liquid 4 application  60 mL Topical Once Sharma Covert, MD      . fentaNYL (SUBLIMAZE) injection 50-100 mcg  50-100 mcg Intravenous PRN Bedelia Person, MD      . lactated ringers infusion   Intravenous Continuous Kerby Nora, MD 20 mL/hr at 10/30/11 1610    . lidocaine (XYLOCAINE) 1 % injection           . midazolam (VERSED) injection 1-2 mg  1-2 mg Intravenous PRN Bedelia Person, MD      . mupirocin ointment Idelle Jo) 2 %   Nasal BID Sharma Covert, MD   1 application at 10/30/11 1355   Medications Prior to Admission  Medication Sig Dispense Refill  . ALPRAZolam (XANAX) 0.5 MG tablet Take 0.5 mg by mouth 4 (four) times daily as needed. For anxiety      . amoxicillin-clavulanate  (AUGMENTIN) 875-125 MG per tablet Take 1 tablet by mouth 2 (two) times daily. Starting 10/23/11 ending 11/06/11      . divalproex (DEPAKOTE ER) 500 MG 24 hr tablet Take 1,500 mg by mouth daily. At 6pm      . oxyCODONE-acetaminophen (PERCOCET) 5-325 MG per tablet Take 1 tablet by mouth every 4 (four) hours as needed. For pain      . sertraline (ZOLOFT) 100 MG tablet Take 150 mg by mouth every morning.      . traZODone (DESYREL) 100 MG tablet Take 100 mg by mouth at bedtime as needed. For sleep        Results for orders placed during the hospital encounter of 10/30/11 (from the past 48 hour(s))  SURGICAL PCR SCREEN     Status: Normal   Collection Time   10/30/11  1:39 PM      Component Value Range Comment   MRSA, PCR NEGATIVE  NEGATIVE     Staphylococcus aureus NEGATIVE  NEGATIVE    CBC     Status: Normal   Collection Time   10/30/11  1:40 PM      Component Value  Range Comment   WBC 4.8  4.0 - 10.5 (K/uL)    RBC 4.23  4.22 - 5.81 (MIL/uL)    Hemoglobin 13.7  13.0 - 17.0 (g/dL)    HCT 16.1  09.6 - 04.5 (%)    MCV 94.8  78.0 - 100.0 (fL)    MCH 32.4  26.0 - 34.0 (pg)    MCHC 34.2  30.0 - 36.0 (g/dL)    RDW 40.9  81.1 - 91.4 (%)    Platelets 190  150 - 400 (K/uL)    No results found.  No recent illnesses or hospitalizations  Blood pressure 129/69, pulse 61, temperature 98 F (36.7 C), temperature source Oral, resp. rate 18, SpO2 98.00%. General Appearance:  Alert, cooperative, no distress, appears stated age  Head:  Normocephalic, without obvious abnormality, atraumatic  Eyes:  Pupils equal, conjunctiva/corneas clear,         Throat: Lips, mucosa, and tongue normal; teeth and gums normal  Neck: No visible masses     Lungs:   respirations unlabored  Chest Wall:  No tenderness or deformity  Heart:  Regular rate and rhythm,  Abdomen:   Soft, non-tender,         Extremities: Right long finger open wound with exposed tendon long finger. Mild swelling no purulence Fingers warm well  perfuse  Pulses: 2+ and symmetric  Skin: Skin color, texture, turgor normal, no rashes or lesions     Neurologic: Normal    Assessment/Plan Right long finger infection with open pip joint  Right long finger open debridement and delayed wound closure  R/B/A DISCUSSED WITH PT IN OFFICE.  PT VOICED UNDERSTANDING OF PLAN CONSENT SIGNED DAY OF SURGERY PT SEEN AND EXAMINED PRIOR TO OPERATIVE PROCEDURE/DAY OF SURGERY SITE MARKED. QUESTIONS ANSWERED WILL [facility] FOLLOWING SURGERY  Sharma Covert 10/30/2011, 4:34 PM

## 2011-10-30 NOTE — Preoperative (Signed)
Beta Blockers   Reason not to administer Beta Blockers:Not Applicable 

## 2011-10-30 NOTE — Anesthesia Preprocedure Evaluation (Addendum)
Anesthesia Evaluation  Patient identified by MRN, date of birth, ID band Patient awake    Reviewed: Allergy & Precautions, H&P , NPO status , Patient's Chart, lab work & pertinent test results, reviewed documented beta blocker date and time   Airway Mallampati: I TM Distance: >3 FB Neck ROM: Full    Dental  (+) Edentulous Upper   Pulmonary COPDCurrent Smoker,    Pulmonary exam normal       Cardiovascular     Neuro/Psych PSYCHIATRIC DISORDERS Depression Bipolar Disorder Schizophrenia    GI/Hepatic   Endo/Other    Renal/GU      Musculoskeletal   Abdominal   Peds  Hematology   Anesthesia Other Findings   Reproductive/Obstetrics                          Anesthesia Physical Anesthesia Plan  ASA: II  Anesthesia Plan: General   Post-op Pain Management:    Induction: Intravenous  Airway Management Planned: LMA  Additional Equipment:   Intra-op Plan:   Post-operative Plan: Extubation in OR  Informed Consent: I have reviewed the patients History and Physical, chart, labs and discussed the procedure including the risks, benefits and alternatives for the proposed anesthesia with the patient or authorized representative who has indicated his/her understanding and acceptance.   Dental advisory given  Plan Discussed with: CRNA, Surgeon and Anesthesiologist  Anesthesia Plan Comments:        Anesthesia Quick Evaluation

## 2011-10-30 NOTE — Anesthesia Postprocedure Evaluation (Signed)
  Anesthesia Post-op Note  Patient: Luke Craig  Procedure(s) Performed: Procedure(s) (LRB): IRRIGATION AND DEBRIDEMENT EXTREMITY (Right)  Patient Location: PACU  Anesthesia Type: General  Level of Consciousness: awake and alert   Airway and Oxygen Therapy: Patient Spontanous Breathing  Post-op Pain: mild  Post-op Assessment: Post-op Vital signs reviewed, Patient's Cardiovascular Status Stable, Respiratory Function Stable, Patent Airway, No signs of Nausea or vomiting and Pain level controlled  Post-op Vital Signs: stable  Complications: No apparent anesthesia complications

## 2011-10-30 NOTE — Transfer of Care (Signed)
Immediate Anesthesia Transfer of Care Note  Patient: Luke Craig  Procedure(s) Performed: Procedure(s) (LRB): IRRIGATION AND DEBRIDEMENT EXTREMITY (Right)  Patient Location: PACU  Anesthesia Type: General  Level of Consciousness: awake, alert  and oriented  Airway & Oxygen Therapy: Patient Spontanous Breathing and Patient connected to face mask oxygen  Post-op Assessment: Report given to PACU RN and Post -op Vital signs reviewed and stable  Post vital signs: Reviewed and stable  Complications: No apparent anesthesia complications

## 2011-10-30 NOTE — Brief Op Note (Signed)
10/30/2011  5:42 PM  PATIENT:  Luke Craig  46 y.o. male  PRE-OPERATIVE DIAGNOSIS:  right long finger laceration  POST-OPERATIVE DIAGNOSIS:  right long finger laceration  PROCEDURE:  Procedure(s) (LRB): IRRIGATION AND DEBRIDEMENT EXTREMITY (Right)  SURGEON:  Surgeon(s) and Role:    * Sharma Covert, MD - Primary  PHYSICIAN ASSISTANT:   ASSISTANTS: none   ANESTHESIA:   general  EBL:     BLOOD ADMINISTERED:none  DRAINS: none   LOCAL MEDICATIONS USED:  NONE  SPECIMEN:  No Specimen  DISPOSITION OF SPECIMEN:  N/A  COUNTS:  YES  TOURNIQUET:   Total Tourniquet Time Documented: Upper Arm (Right) - 8 minutes  DICTATION: .Other Dictation: Dictation Number [id number]  PLAN OF CARE: Discharge to home after PACU  PATIENT DISPOSITION:  PACU - hemodynamically stable.   Delay start of Pharmacological VTE agent (>24hrs) due to surgical blood loss or risk of bleeding: not applicable

## 2011-10-31 NOTE — Op Note (Signed)
NAMELLEWELYN, SHEAFFER NO.:  [id number]  MEDICAL RECORD NO.:  [id number]  LOCATION:  [facility]                         FACILITY:  [facility]  PHYSICIAN:  Madelynn Done, MD  DATE OF BIRTH:  1966-06-11  DATE OF PROCEDURE:  10/30/2011 DATE OF DISCHARGE:  10/30/2011                              OPERATIVE REPORT   PREOPERATIVE DIAGNOSIS:  Right long finger open wound with open proximal interphalangeal joint.  POSTOPERATIVE DIAGNOSIS:  Right long finger open wound with open proximal interphalangeal joint.  ATTENDING PHYSICIAN:  Sharma Covert IV, MD, who scrubbed and present for the entire procedure.  ASSISTANT SURGEON:  None.  SURGICAL PROCEDURES: 1. Right long finger extensor tendon tenosynovectomy and excisional     debridement, dorsum of right long finger. 2. Right long finger proximal interphalangeal joint arthrotomy,     exploration, and drainage. 3. Laceration closure, 3 cm.  SURGICAL INDICATIONS:  Mr. Barsky is a right-hand-dominant gentleman who had a worsening pain and swelling over the dorsal aspect of his long finger.  The patient seen in [facility], underwent aspiration and opening up of the joint.  He was seen and evaluated in the office given the open wound directly over the joint.  It was recommended that the patient undergo the above procedure.  Risks, benefits, and alternatives were discussed in detail with the patient and signed informed consent was obtained.  Risks include but not limited to bleeding; infection; damage to nearby nerves, arteries, or tendons; loss of motion of elbow, wrist, and digits; and need for further surgical intervention.  DESCRIPTION OF PROCEDURE:  The patient was properly identified in the preoperative holding area and a mark with a permanent marker made on the right long finger to indicate correct operative site.  The patient was then brought back to the operating room and placed supine on the anesthesia  room table.  General anesthesia was administered.  The patient tolerated this well.  A well-padded tourniquet was then placed in the right brachium and sealed with 1000 drape.  Right upper extremity was then prepped and draped in normal sterile fashion.  Time-out was called,  correct side was identified, and procedure then begun. Attention was then turned to the right long finger, where the previous traumatic laceration was then extended both proximally and distally. Dissection then carried down to the extensor tendon, where aggressive tenosynovectomy was then carried out in long finger extensor mechanism, removing the necrotic tissue along the course of the extensor tendon. This was excisional with a sharp knife, curettes, and rongeurs. Following this, there was a small hole within the extensor mechanism. This was extended to open up the joint.  The joint was then thoroughly evaluated.  Did not appear to be any significant pathological joint fluid or any cartilaginous injury to the joint.  After thorough irrigation, arthrotomy and joint exploration and drainage was then carried out of the joint.  The wound was then copiously irrigated.  The arthrotomy was then closed with 1 simple 4-0 Monocryl suture.  The traumatic laceration and skin were then closed with simple Prolene sutures.  Adaptic dressing and a sterile compressive bandage then applied.  The patient tolerated the procedure  well and returned to the recovery room in good condition.  POSTOPERATIVE PLAN:  The patient will be discharged to home.  Will be seen back in the office in approximately 7 days for wound check and motion assessment.     Madelynn Done, MD     FWO/MEDQ  D:  10/30/2011  T:  10/31/2011  Job:  [id number]

## 2011-11-01 ENCOUNTER — Encounter (HOSPITAL_COMMUNITY): Payer: Self-pay | Admitting: Orthopedic Surgery

## 2012-04-28 ENCOUNTER — Encounter (HOSPITAL_COMMUNITY): Payer: Self-pay | Admitting: *Deleted

## 2012-04-28 ENCOUNTER — Emergency Department (HOSPITAL_COMMUNITY)
Admission: EM | Admit: 2012-04-28 | Discharge: 2012-04-29 | Disposition: A | Discharge status: Home / Self Care | Payer: Self-pay | Attending: Emergency Medicine | Admitting: Emergency Medicine

## 2012-04-28 DIAGNOSIS — M545 Low back pain, unspecified: Secondary | ICD-10-CM | POA: Insufficient documentation

## 2012-04-28 DIAGNOSIS — T148XXA Other injury of unspecified body region, initial encounter: Secondary | ICD-10-CM | POA: Insufficient documentation

## 2012-04-28 DIAGNOSIS — M542 Cervicalgia: Secondary | ICD-10-CM | POA: Insufficient documentation

## 2012-04-28 DIAGNOSIS — F17200 Nicotine dependence, unspecified, uncomplicated: Secondary | ICD-10-CM | POA: Insufficient documentation

## 2012-04-28 DIAGNOSIS — M549 Dorsalgia, unspecified: Secondary | ICD-10-CM | POA: Insufficient documentation

## 2012-04-28 DIAGNOSIS — W19XXXA Unspecified fall, initial encounter: Secondary | ICD-10-CM

## 2012-04-28 DIAGNOSIS — W11XXXA Fall on and from ladder, initial encounter: Secondary | ICD-10-CM | POA: Insufficient documentation

## 2012-04-28 DIAGNOSIS — T1490XA Injury, unspecified, initial encounter: Secondary | ICD-10-CM | POA: Insufficient documentation

## 2012-04-28 DIAGNOSIS — M79609 Pain in unspecified limb: Secondary | ICD-10-CM | POA: Insufficient documentation

## 2012-04-28 NOTE — ED Notes (Signed)
Pt states he fell from a ladder about 12' landing on the ground around 1900 tonight. Denies LOC.

## 2012-04-28 NOTE — ED Provider Notes (Signed)
History   This chart was scribed for Sunnie Nielsen, MD by Gerlean Ren. This patient was seen in room [facility] and the patient's care was started at 23:40.   CSN: [id number]  Arrival date & time 04/28/12  2237   First MD Initiated Contact with Patient 04/28/12 2329      Chief Complaint  Patient presents with  . Fall    (Consider location/radiation/quality/duration/timing/severity/associated sxs/prior treatment) The history is provided by the patient. No language interpreter was used.  Luke Craig is a 46 y.o. male who presents to the Emergency Department complaining of right heel and lower back pain after falling 10-12 feet from a ladder 5 hours ago onto lower back with slight bracing from left heel.  Pt denies any head trauma, LOC, or further injury from fall and was ambulatory immediately afterwards.  Pt has h/o bipolar disorder, schizophrenia, and depression.  Pt is a current everyday smoker but denies alcohol use.   Past Medical History  Diagnosis Date  . Bipolar 1 disorder   . Intermittent explosive disorder   . Schizophrenia   . Depression   . Bronchitis     hx  . History of kidney stones   . Open wound of right middle finger without damage to nail     Past Surgical History  Procedure Date  . Bunionectomy   . Shoulder surgery   . Vasectomy   . I&d extremity 10/30/2011    Procedure: IRRIGATION AND DEBRIDEMENT EXTREMITY;  Surgeon: Sharma Covert, MD;  Location: [facility] OR;  Service: Orthopedics;  Laterality: Right;  Wound exploration right middle finger with repair    No family history on file.  History  Substance Use Topics  . Smoking status: Current Every Day Smoker -- 1.0 packs/day    Types: Cigarettes  . Smokeless tobacco: Not on file  . Alcohol Use: No      Review of Systems  Musculoskeletal: Positive for back pain.  All other systems reviewed and are negative.    Allergies  Aspirin; Ibuprofen; and Tramadol  Home Medications   Current Outpatient  Rx  Name Route Sig Dispense Refill  . ALPRAZOLAM 0.5 MG PO TABS Oral Take 0.5 mg by mouth 4 (four) times daily as needed. For anxiety    . AMOXICILLIN-POT CLAVULANATE 875-125 MG PO TABS Oral Take 1 tablet by mouth 2 (two) times daily. Starting 10/23/11 ending 11/06/11    . DIVALPROEX SODIUM ER 500 MG PO TB24 Oral Take 1,500 mg by mouth daily. At 6pm    . OXYCODONE-ACETAMINOPHEN 5-325 MG PO TABS Oral Take 1 tablet by mouth every 4 (four) hours as needed. For pain 31 tablet 0  . SERTRALINE HCL 100 MG PO TABS Oral Take 150 mg by mouth every morning.    . TRAZODONE HCL 100 MG PO TABS Oral Take 100 mg by mouth at bedtime as needed. For sleep      BP 120/75  Pulse 77  Temp 98.6 F (37 C) (Oral)  Resp 20  Ht 5\' 11"  (1.803 m)  Wt 200 lb (90.719 kg)  BMI 27.89 kg/m2  SpO2 100%  Physical Exam  Nursing note and vitals reviewed. Constitutional: He is oriented to person, place, and time. He appears well-developed and well-nourished.  HENT:  Head: Normocephalic and atraumatic.  Eyes: EOM are normal.  Neck: No tracheal deviation present.       Pt is in c-collar put on here.  Cardiovascular: Normal rate, regular rhythm and normal heart sounds.  No murmur heard. Pulmonary/Chest: Effort normal and breath sounds normal. He has no wheezes. He exhibits no tenderness.  Abdominal: Soft. He exhibits no distension. There is no tenderness.  Musculoskeletal: Normal range of motion. He exhibits no edema.       Tenderness over right paralumbar region with no swelling or deformity.    Mild tenderness over right calcaneous with no swelling or deformity.  Distal neurovascularly intact x4.   Bilateral knees non-tender.  Left foot non-tender.     Neurological: He is alert and oriented to person, place, and time.  Skin: Skin is warm.  Psychiatric: He has a normal mood and affect.    ED Course  Procedures (including critical care time) DIAGNOSTIC STUDIES: Oxygen Saturation is 100% on room air, normal by my  interpretation.    COORDINATION OF CARE: 23:45- Patient informed of clinical course, understands medical decision-making process, and agrees with plan.  Ordered ice, c-spine XR, lumbar spine XR, and right calcis XR.    Results for orders placed during the hospital encounter of 10/30/11  SURGICAL PCR SCREEN      Component Value Range   MRSA, PCR NEGATIVE  NEGATIVE   Staphylococcus aureus NEGATIVE  NEGATIVE  CBC      Component Value Range   WBC 4.8  4.0 - 10.5 K/uL   RBC 4.23  4.22 - 5.81 MIL/uL   Hemoglobin 13.7  13.0 - 17.0 g/dL   HCT 96.0  45.4 - 09.8 %   MCV 94.8  78.0 - 100.0 fL   MCH 32.4  26.0 - 34.0 pg   MCHC 34.2  30.0 - 36.0 g/dL   RDW 11.9  14.7 - 82.9 %   Platelets 190  150 - 400 K/uL   Dg Cervical Spine Complete  04/29/2012  **ADDENDUM** CREATED: 04/29/2012 01:01:22  Focal angulation is demonstrated in the superior endplate of C7 with intact cortex and without prevertebral soft tissue swelling. Similar changes are suggested on previous CT cervical spine from 04/18/2011.  Changes likely represent hypertrophic changes or old fracture deformity.  No acute displaced fracture identified.  **END ADDENDUM** SIGNED BY: Marlon Pel, M.D.   04/29/2012  *RADIOLOGY REPORT*  Clinical Data: Neck pain after fall from ladder.  CERVICAL SPINE - COMPLETE 4+ VIEW  Comparison: None.  Findings: Normal alignment of the cervical vertebrae and facet joints.  No vertebral compression deformities.  Intervertebral disc space heights are preserved.  Mild endplate hypertrophic changes in the lower cervical region.  No prevertebral soft tissue swelling. Lateral masses of C1 appear symmetrical.  The odontoid process appears intact.  No focal bone lesion or bone destruction.  Bone cortex and trabecular architecture appear intact.  IMPRESSION: No displaced fractures identified.  Mild degenerative changes.   Original Report Authenticated By: Marlon Pel, M.D.    Dg Lumbar Spine  Complete  04/29/2012  *RADIOLOGY REPORT*  Clinical Data: Low back pain after fall from ladder.  LUMBAR SPINE - COMPLETE 4+ VIEW  Comparison: 10/07/2010  Findings: Five lumbar type vertebra.  Normal alignment of the lumbar vertebrae and facet joints.  Mild degenerative changes with endplate hypertrophic changes throughout the lumbar spine. Intervertebral disc space heights are preserved.  No vertebral compression deformities.  No focal bone lesion or bone destruction. Bone cortex and trabecular architecture appear intact.  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  No displaced fractures identified.   Original Report Authenticated By: Marlon Pel, M.D.    Dg Os Calcis Right  04/29/2012  *  RADIOLOGY REPORT*  Clinical Data: Low back pain, neck pain, and right heel pain after falling 10-12 feet from ladder.  RIGHT OS CALCIS - 2+ VIEW  Comparison: None.  Findings: The right calcaneus appears intact. No evidence of acute fracture or subluxation.  No focal bone lesions.  Bone matrix and cortex appear intact.  No abnormal radiopaque densities in the soft tissues.  IMPRESSION: No acute bony abnormalities.   Original Report Authenticated By: Marlon Pel, M.D.     IM Dilaudid.  xrays obtained and reviewed. Pain improving still present on recheck at 2:52 AM No LE defictis with equal strengths, sensorium and DTRs. C spine cleared no lower C spine or T spine tenderness. Plan d/c home, RX and fall/ contusion/ back pain precautions provided and verbalized as understood.     MDM  LBP and calcaneal pain after fall. Imaging as above. IM narcotics. VS and nursing notes reviewed.    I personally performed the services described in this documentation, which was scribed in my presence. The recorded information has been reviewed and considered.          Sunnie Nielsen, MD 04/29/12 [phone]

## 2012-04-28 NOTE — ED Notes (Signed)
States he fell off a ladder about 12 feet tonight,c/o pain in his back, denies any LOC

## 2012-04-29 ENCOUNTER — Emergency Department (HOSPITAL_COMMUNITY): Payer: Self-pay

## 2012-04-29 MED ORDER — HYDROMORPHONE HCL PF 1 MG/ML IJ SOLN
1.0000 mg | Freq: Once | INTRAMUSCULAR | Status: AC
  Administered 2012-04-29: 1 mg via INTRAMUSCULAR
  Filled 2012-04-29: qty 1

## 2012-04-29 MED ORDER — CYCLOBENZAPRINE HCL 10 MG PO TABS
10.0000 mg | ORAL_TABLET | Freq: Once | ORAL | Status: AC
  Administered 2012-04-29: 10 mg via ORAL
  Filled 2012-04-29: qty 1

## 2012-04-29 MED ORDER — CYCLOBENZAPRINE HCL 10 MG PO TABS: 10.0000 mg | ORAL_TABLET | Freq: Two times a day (BID) | ORAL | Status: DC | PRN

## 2012-04-29 MED ORDER — ONDANSETRON 8 MG PO TBDP
8.0000 mg | ORAL_TABLET | Freq: Once | ORAL | Status: AC
  Administered 2012-04-29: 8 mg via ORAL
  Filled 2012-04-29: qty 1

## 2012-04-29 MED ORDER — OXYCODONE-ACETAMINOPHEN 5-325 MG PO TABS: 1.0000 | ORAL_TABLET | ORAL | Status: DC | PRN

## 2012-04-29 MED ORDER — OXYCODONE-ACETAMINOPHEN 5-325 MG PO TABS
2.0000 | ORAL_TABLET | Freq: Once | ORAL | Status: AC
  Administered 2012-04-29: 2 via ORAL
  Filled 2012-04-29: qty 2

## 2012-04-29 NOTE — ED Notes (Signed)
c collar removed after negative xray results.

## 2012-04-29 NOTE — ED Notes (Signed)
Pt alert & oriented x4, stable gait. Patient given discharge instructions, paperwork & prescription(s). Patient  instructed to stop at the registration desk to finish any additional paperwork. Patient verbalized understanding. Pt left department w/ no further questions. 

## 2012-07-29 ENCOUNTER — Encounter (HOSPITAL_COMMUNITY): Payer: Self-pay | Admitting: *Deleted

## 2012-07-29 ENCOUNTER — Emergency Department (HOSPITAL_COMMUNITY)
Admission: EM | Admit: 2012-07-29 | Discharge: 2012-07-29 | Disposition: A | Discharge status: Home / Self Care | Payer: Self-pay | Attending: Emergency Medicine | Admitting: Emergency Medicine

## 2012-07-29 DIAGNOSIS — F2089 Other schizophrenia: Secondary | ICD-10-CM | POA: Insufficient documentation

## 2012-07-29 DIAGNOSIS — Z79899 Other long term (current) drug therapy: Secondary | ICD-10-CM | POA: Insufficient documentation

## 2012-07-29 DIAGNOSIS — F329 Major depressive disorder, single episode, unspecified: Secondary | ICD-10-CM | POA: Insufficient documentation

## 2012-07-29 DIAGNOSIS — Z87442 Personal history of urinary calculi: Secondary | ICD-10-CM | POA: Insufficient documentation

## 2012-07-29 DIAGNOSIS — R05 Cough: Secondary | ICD-10-CM | POA: Insufficient documentation

## 2012-07-29 DIAGNOSIS — M79606 Pain in leg, unspecified: Secondary | ICD-10-CM

## 2012-07-29 DIAGNOSIS — E876 Hypokalemia: Secondary | ICD-10-CM | POA: Insufficient documentation

## 2012-07-29 DIAGNOSIS — Z8709 Personal history of other diseases of the respiratory system: Secondary | ICD-10-CM | POA: Insufficient documentation

## 2012-07-29 DIAGNOSIS — F17200 Nicotine dependence, unspecified, uncomplicated: Secondary | ICD-10-CM | POA: Insufficient documentation

## 2012-07-29 DIAGNOSIS — F3289 Other specified depressive episodes: Secondary | ICD-10-CM | POA: Insufficient documentation

## 2012-07-29 DIAGNOSIS — M79609 Pain in unspecified limb: Secondary | ICD-10-CM | POA: Insufficient documentation

## 2012-07-29 DIAGNOSIS — R059 Cough, unspecified: Secondary | ICD-10-CM | POA: Insufficient documentation

## 2012-07-29 DIAGNOSIS — F319 Bipolar disorder, unspecified: Secondary | ICD-10-CM | POA: Insufficient documentation

## 2012-07-29 LAB — BASIC METABOLIC PANEL
CO2: 24 mEq/L (ref 19–32)
Calcium: 8.5 mg/dL (ref 8.4–10.5)
Creatinine, Ser: 0.78 mg/dL (ref 0.50–1.35)
GFR calc non Af Amer: >90 mL/min (ref >90)
Sodium: 138 mEq/L (ref 135–145)

## 2012-07-29 MED ORDER — HYDROCODONE-ACETAMINOPHEN 5-325 MG PO TABS: ORAL_TABLET | ORAL | Status: DC

## 2012-07-29 MED ORDER — HYDROCODONE-ACETAMINOPHEN 5-325 MG PO TABS
2.0000 | ORAL_TABLET | Freq: Once | ORAL | Status: AC
  Administered 2012-07-29: 2 via ORAL
  Filled 2012-07-29: qty 2

## 2012-07-29 MED ORDER — POTASSIUM CHLORIDE CRYS ER 20 MEQ PO TBCR
40.0000 meq | EXTENDED_RELEASE_TABLET | Freq: Once | ORAL | Status: AC
  Administered 2012-07-29: 40 meq via ORAL
  Filled 2012-07-29: qty 2

## 2012-07-29 MED ORDER — PROMETHAZINE HCL 12.5 MG PO TABS
12.5000 mg | ORAL_TABLET | Freq: Once | ORAL | Status: AC
  Administered 2012-07-29: 12.5 mg via ORAL
  Filled 2012-07-29: qty 1

## 2012-07-29 NOTE — ED Notes (Signed)
Patient requesting pain medication, Ivery Quale notified.

## 2012-07-29 NOTE — ED Provider Notes (Signed)
History     CSN: [id number]  Arrival date & time 07/29/12  2021   First MD Initiated Contact with Patient 07/29/12 2034      Chief Complaint  Patient presents with  . Leg Pain    (Consider location/radiation/quality/duration/timing/severity/associated sxs/prior treatment) Patient is a 47 y.o. male presenting with leg pain. The history is provided by the patient.  Leg Pain  The incident occurred 6 to 12 hours ago. The incident occurred at home. There was no injury mechanism. Pain location: right calf. The quality of the pain is described as aching. The pain is moderate. The pain has been constant since onset. Associated symptoms include inability to bear weight. Pertinent negatives include no numbness, no loss of motion and no loss of sensation. He reports no foreign bodies present. The symptoms are aggravated by bearing weight. He has tried nothing for the symptoms. The treatment provided no relief.    Past Medical History  Diagnosis Date  . Bipolar 1 disorder   . Intermittent explosive disorder   . Schizophrenia   . Depression   . Bronchitis     hx  . History of kidney stones   . Open wound of right middle finger without damage to nail     Past Surgical History  Procedure Date  . Bunionectomy   . Shoulder surgery   . Vasectomy   . I&d extremity 10/30/2011    Procedure: IRRIGATION AND DEBRIDEMENT EXTREMITY;  Surgeon: Sharma Covert, MD;  Location: [facility] OR;  Service: Orthopedics;  Laterality: Right;  Wound exploration right middle finger with repair    History reviewed. No pertinent family history.  History  Substance Use Topics  . Smoking status: Current Every Day Smoker -- 1.0 packs/day    Types: Cigarettes  . Smokeless tobacco: Not on file  . Alcohol Use: No      Review of Systems  Constitutional: Negative for activity change.       All ROS Neg except as noted in HPI  HENT: Negative for nosebleeds and neck pain.   Eyes: Negative for photophobia and discharge.    Respiratory: Positive for cough. Negative for shortness of breath and wheezing.   Cardiovascular: Negative for chest pain and palpitations.  Gastrointestinal: Negative for abdominal pain and blood in stool.  Genitourinary: Negative for dysuria, frequency and hematuria.  Musculoskeletal: Negative for back pain and arthralgias.  Skin: Negative.   Neurological: Negative for dizziness, seizures, speech difficulty and numbness.  Psychiatric/Behavioral: Negative for hallucinations and confusion.       Depression and schizophrenia    Allergies  Aspirin; Ibuprofen; and Tramadol  Home Medications   Current Outpatient Rx  Name  Route  Sig  Dispense  Refill  . ACETAMINOPHEN 500 MG PO TABS   Oral   Take 1,000 mg by mouth 3 (three) times daily as needed. For pain         . ALPRAZOLAM 0.5 MG PO TABS   Oral   Take 0.5 mg by mouth 4 (four) times daily as needed. For anxiety         . DIVALPROEX SODIUM ER 500 MG PO TB24   Oral   Take 1,500 mg by mouth daily. At 6pm         . SERTRALINE HCL 100 MG PO TABS   Oral   Take 150 mg by mouth every morning.         . TRAZODONE HCL 100 MG PO TABS   Oral   Take 100  mg by mouth at bedtime as needed. For sleep           BP 129/79  Pulse 61  Temp 97.9 F (36.6 C) (Oral)  Resp 18  Ht 5\' 11"  (1.803 m)  Wt 202 lb (91.627 kg)  BMI 28.17 kg/m2  SpO2 99%  Physical Exam  Nursing note and vitals reviewed. Constitutional: He is oriented to person, place, and time. He appears well-developed and well-nourished.  Non-toxic appearance.  HENT:  Head: Normocephalic.  Right Ear: Tympanic membrane and external ear normal.  Left Ear: Tympanic membrane and external ear normal.  Eyes: EOM and lids are normal. Pupils are equal, round, and reactive to light.  Neck: Normal range of motion. Neck supple. Carotid bruit is not present.  Cardiovascular: Normal rate, regular rhythm, normal heart sounds, intact distal pulses and normal pulses.    Pulmonary/Chest: Breath sounds normal. No respiratory distress.  Abdominal: Soft. Bowel sounds are normal. There is no tenderness. There is no guarding.  Musculoskeletal: Normal range of motion.       FROM of the right hip and knee. Soreness of the right calf to palpation and with flexing the right ankle. No hot areas, no swelling. No red streaking.   Lymphadenopathy:       Head (right side): No submandibular adenopathy present.       Head (left side): No submandibular adenopathy present.    He has no cervical adenopathy.  Neurological: He is alert and oriented to person, place, and time. He has normal strength. No cranial nerve deficit or sensory deficit.  Skin: Skin is warm and dry.  Psychiatric: He has a normal mood and affect. His speech is normal.    ED Course  Procedures (including critical care time)   Labs Reviewed  BASIC METABOLIC PANEL  D-DIMER, QUANTITATIVE   No results found.   No diagnosis found.    MDM  I have reviewed nursing notes, vital signs, and all appropriate lab and imaging results for this patient.  Pain improved with Norco here in the emergency department. The d-dimer is negative at 0.27. The potassium is slightly low at 3.3, the remainder of the basic metabolic panel is within normal limits. The patient is given instructions on foods high in potassium. He is given a prescription for Norco 5 mg #20 tablets. The patient is advised to see his primary physician for reevaluation and recheck of his potassium and for followup of the pain in his calf.  The patient has no increase redness. There is no swelling of the calf. There's been no prolonged trips. There no known high hormonal states or taking of additional hormones by the patient. The d-dimer is negative. I have a low suspicion for deep vein thrombosis at this time. Feel that it is safe for the patient to be discharged home with instructions as noted above.      Kathie Dike, [place] 07/29/12 2234

## 2012-07-29 NOTE — ED Notes (Signed)
Pain calf of rt leg, onset this am, no known injury.  Increased pain with movement of foot.

## 2012-07-30 NOTE — ED Provider Notes (Signed)
Medical screening examination/treatment/procedure(s) were performed by non-physician practitioner and as supervising physician I was immediately available for consultation/collaboration. Devoria Albe, MD, Armando Gang   Ward Givens, MD 07/30/12 [phone]

## 2012-12-24 ENCOUNTER — Encounter (HOSPITAL_COMMUNITY): Payer: Self-pay | Admitting: *Deleted

## 2012-12-24 ENCOUNTER — Emergency Department (HOSPITAL_COMMUNITY): Payer: Self-pay

## 2012-12-24 ENCOUNTER — Emergency Department (HOSPITAL_COMMUNITY)
Admission: EM | Admit: 2012-12-24 | Discharge: 2012-12-25 | Disposition: A | Discharge status: Home / Self Care | Payer: Self-pay | Attending: Emergency Medicine | Admitting: Emergency Medicine

## 2012-12-24 DIAGNOSIS — Z8709 Personal history of other diseases of the respiratory system: Secondary | ICD-10-CM | POA: Insufficient documentation

## 2012-12-24 DIAGNOSIS — W010XXA Fall on same level from slipping, tripping and stumbling without subsequent striking against object, initial encounter: Secondary | ICD-10-CM | POA: Insufficient documentation

## 2012-12-24 DIAGNOSIS — F209 Schizophrenia, unspecified: Secondary | ICD-10-CM | POA: Insufficient documentation

## 2012-12-24 DIAGNOSIS — F17200 Nicotine dependence, unspecified, uncomplicated: Secondary | ICD-10-CM | POA: Insufficient documentation

## 2012-12-24 DIAGNOSIS — Z87828 Personal history of other (healed) physical injury and trauma: Secondary | ICD-10-CM | POA: Insufficient documentation

## 2012-12-24 DIAGNOSIS — Y92009 Unspecified place in unspecified non-institutional (private) residence as the place of occurrence of the external cause: Secondary | ICD-10-CM | POA: Insufficient documentation

## 2012-12-24 DIAGNOSIS — Z79899 Other long term (current) drug therapy: Secondary | ICD-10-CM | POA: Insufficient documentation

## 2012-12-24 DIAGNOSIS — IMO0002 Reserved for concepts with insufficient information to code with codable children: Secondary | ICD-10-CM | POA: Insufficient documentation

## 2012-12-24 DIAGNOSIS — Z8659 Personal history of other mental and behavioral disorders: Secondary | ICD-10-CM | POA: Insufficient documentation

## 2012-12-24 DIAGNOSIS — Y9389 Activity, other specified: Secondary | ICD-10-CM | POA: Insufficient documentation

## 2012-12-24 DIAGNOSIS — R55 Syncope and collapse: Secondary | ICD-10-CM | POA: Insufficient documentation

## 2012-12-24 DIAGNOSIS — T07XXXA Unspecified multiple injuries, initial encounter: Secondary | ICD-10-CM

## 2012-12-24 DIAGNOSIS — W1809XA Striking against other object with subsequent fall, initial encounter: Secondary | ICD-10-CM | POA: Insufficient documentation

## 2012-12-24 DIAGNOSIS — S0003XA Contusion of scalp, initial encounter: Secondary | ICD-10-CM | POA: Insufficient documentation

## 2012-12-24 DIAGNOSIS — S0083XA Contusion of other part of head, initial encounter: Secondary | ICD-10-CM | POA: Insufficient documentation

## 2012-12-24 DIAGNOSIS — Z87442 Personal history of urinary calculi: Secondary | ICD-10-CM | POA: Insufficient documentation

## 2012-12-24 DIAGNOSIS — W19XXXA Unspecified fall, initial encounter: Secondary | ICD-10-CM

## 2012-12-24 DIAGNOSIS — F319 Bipolar disorder, unspecified: Secondary | ICD-10-CM | POA: Insufficient documentation

## 2012-12-24 MED ORDER — KETOROLAC TROMETHAMINE 60 MG/2ML IM SOLN
60.0000 mg | Freq: Once | INTRAMUSCULAR | Status: AC
  Administered 2012-12-24: 60 mg via INTRAMUSCULAR
  Filled 2012-12-24: qty 2

## 2012-12-24 NOTE — ED Notes (Signed)
Denies N/V or confusion, wife drove pt here

## 2012-12-24 NOTE — ED Provider Notes (Signed)
History  This chart was scribed for [employer]. Colon Branch, MD by Ardelia Mems, ED Scribe. This patient was seen in room APA14/[facility]14 and the patient's care was started at 10:57 PM.   CSN: [id number]  Arrival date & time 12/24/12  2044     Chief Complaint  Patient presents with  . Fall  . Arm Pain     The history is provided by the patient. No language interpreter was used.    HPI Comments: Luke Craig is a 47 y.o. male who presents to the Emergency Department complaining of injuries onset after a fall that occurred earlier today. Pt states that he stepped on an exhaust pipe at his home, which rolled underneath him causing him to fall. Pt has bruises, contusions and constant, moderate pain over his right forearm. Pt also hit his left temple and has a small hematoma over his parietal bone with mild associated pain. Pt reports  No LOC. Pt denies fever, chills, confusion, back pain, visual disturbances or any other symptoms. Pt denies alcohol use and is a current every day smoke of 1 pack/day.  PCP- none   Past Medical History  Diagnosis Date  . Bipolar 1 disorder   . Intermittent explosive disorder   . Schizophrenia   . Depression   . Bronchitis     hx  . History of kidney stones   . Open wound of right middle finger without damage to nail     Past Surgical History  Procedure Laterality Date  . Bunionectomy    . Shoulder surgery    . Vasectomy    . I&d extremity  10/30/2011    Procedure: IRRIGATION AND DEBRIDEMENT EXTREMITY;  Surgeon: Sharma Covert, MD;  Location: [facility] OR;  Service: Orthopedics;  Laterality: Right;  Wound exploration right middle finger with repair    History reviewed. No pertinent family history.  History  Substance Use Topics  . Smoking status: Current Every Day Smoker -- 1.00 packs/day    Types: Cigarettes  . Smokeless tobacco: Not on file  . Alcohol Use: No      Review of Systems  Constitutional: Negative for fever and chills.  HENT: Negative  for congestion, sore throat, rhinorrhea and neck pain.   Eyes: Negative for visual disturbance.  Respiratory: Negative for shortness of breath.   Cardiovascular: Negative for chest pain and leg swelling.  Gastrointestinal: Negative for nausea, vomiting, abdominal pain and diarrhea.  Genitourinary: Negative for dysuria and hematuria.  Musculoskeletal: Negative for back pain.  Skin: Negative for rash.  Neurological: Positive for syncope.  Psychiatric/Behavioral: Negative for confusion.   A complete 10 system review of systems was obtained and all systems are negative except as noted in the HPI and PMH.   Allergies  Aspirin; Ibuprofen; and Tramadol  Home Medications   Current Outpatient Rx  Name  Route  Sig  Dispense  Refill  . acetaminophen (TYLENOL) 500 MG tablet   Oral   Take 1,000 mg by mouth 3 (three) times daily as needed. For pain         . ALPRAZolam (XANAX) 0.5 MG tablet   Oral   Take 0.5 mg by mouth 4 (four) times daily as needed. For anxiety         . BusPIRone HCl (BUSPAR PO)   Oral   Take 3 tablets by mouth 2 (two) times daily.         . divalproex (DEPAKOTE ER) 500 MG 24 hr tablet   Oral  Take 1,500 mg by mouth daily. At 6pm         . sertraline (ZOLOFT) 100 MG tablet   Oral   Take 100 mg by mouth daily.            Triage Vitals: BP 100/65  Pulse 59  Temp(Src) 98.4 F (36.9 C) (Oral)  Resp 20  SpO2 97%  Physical Exam  Nursing note and vitals reviewed. Constitutional: He is oriented to person, place, and time. He appears well-developed and well-nourished.  HENT:  Head: Normocephalic.  Mouth/Throat: Oropharynx is clear and moist.  Eyes: EOM are normal. Pupils are equal, round, and reactive to light.  Neck: Normal range of motion. Neck supple. No tracheal deviation present.  Cardiovascular: Normal rate, regular rhythm, normal heart sounds and intact distal pulses.   No murmur heard. Pulmonary/Chest: Effort normal and breath sounds normal.  No respiratory distress.  Smoker's cough.  Abdominal: Soft. Bowel sounds are normal. There is no tenderness.  Musculoskeletal: Normal range of motion. He exhibits no edema.  Abrasions on right forearm.   Neurological: He is alert and oriented to person, place, and time.  Skin: Skin is warm and dry. No rash noted.  Small hematoma on left temple parietal bone.  Psychiatric: He has a normal mood and affect.    ED Course  Procedures (including critical care time)  DIAGNOSTIC STUDIES: Oxygen Saturation is 97% on RA, normal by my interpretation.    COORDINATION OF CARE: 11:06 PM- Pt advised of plan for treatment and pt agrees.  Medications  ketorolac (TORADOL) injection 60 mg (60 mg Intramuscular Given 12/24/12 2316)     Labs Reviewed - No data to display Dg Forearm Right  12/24/2012   *RADIOLOGY REPORT*  Clinical Data: Right forearm pain after fall today.  RIGHT FOREARM - 2 VIEW  Comparison: None.  Findings: The right radius and ulna appear intact.  There is soft tissue swelling over the dorsal aspect of the mid right forearm. No evidence of acute fracture or subluxation.  No focal bone lesion or bone destruction.  Bone cortex and trabecular architecture appear intact.  No radiopaque soft tissue foreign bodies.  IMPRESSION: Soft tissue swelling.  No acute bony abnormalities demonstrated in the right forearm.   Original Report Authenticated By: Burman Nieves, M.D.        MDM  Patient with a recent fall resulting in abrasions to his right forearm. Xray without evidence of fracture. Reviewed results with patient. Pt stable in ED with no significant deterioration in condition.The patient appears reasonably screened and/or stabilized for discharge and I doubt any other medical condition or other [facility] requiring further screening, evaluation, or treatment in the ED at this time prior to discharge.   I personally performed the services described in this documentation, which was scribed in my  presence. The recorded information has been reviewed and considered.  MDM Reviewed: nursing note and vitals Interpretation: x-ray               Nicoletta Dress. Colon Branch, MD 12/24/12 [phone]

## 2012-12-24 NOTE — ED Notes (Signed)
Patient questioned about allergy to Ibuprofen and ASA.  States it is just po causes stomach pain.  The patient said - let me guess that is Toradol - when I responded yes it is he said it doesn't help him but he did not refuse to take the injections

## 2012-12-24 NOTE — ED Notes (Signed)
Pt states he fell and hit his head on a building, +LOC, also c/o right forearm pain

## 2012-12-25 NOTE — ED Notes (Signed)
Several abrasions cleaned with shur-clens,  Ice pack to areas of swelling with instructions.

## 2014-01-26 ENCOUNTER — Emergency Department (HOSPITAL_COMMUNITY)
Admission: EM | Admit: 2014-01-26 | Discharge: 2014-01-26 | Disposition: A | Discharge status: Home / Self Care | Payer: Self-pay | Attending: Emergency Medicine | Admitting: Emergency Medicine

## 2014-01-26 ENCOUNTER — Encounter (HOSPITAL_COMMUNITY): Payer: Self-pay | Admitting: Emergency Medicine

## 2014-01-26 DIAGNOSIS — F319 Bipolar disorder, unspecified: Secondary | ICD-10-CM | POA: Insufficient documentation

## 2014-01-26 DIAGNOSIS — N200 Calculus of kidney: Secondary | ICD-10-CM | POA: Insufficient documentation

## 2014-01-26 DIAGNOSIS — Z87828 Personal history of other (healed) physical injury and trauma: Secondary | ICD-10-CM | POA: Insufficient documentation

## 2014-01-26 DIAGNOSIS — Z79899 Other long term (current) drug therapy: Secondary | ICD-10-CM | POA: Insufficient documentation

## 2014-01-26 DIAGNOSIS — F17200 Nicotine dependence, unspecified, uncomplicated: Secondary | ICD-10-CM | POA: Insufficient documentation

## 2014-01-26 DIAGNOSIS — Z8709 Personal history of other diseases of the respiratory system: Secondary | ICD-10-CM | POA: Insufficient documentation

## 2014-01-26 LAB — URINE MICROSCOPIC-ADD ON

## 2014-01-26 LAB — CBC WITH DIFFERENTIAL/PLATELET
BASOS PCT: 1 % (ref 0–1)
Basophils Absolute: 0.1 10*3/uL (ref 0.0–0.1)
EOS PCT: 2 % (ref 0–5)
Eosinophils Absolute: 0.1 10*3/uL (ref 0.0–0.7)
HEMATOCRIT: 38.2 % — AB (ref 39.0–52.0)
HEMOGLOBIN: 13.1 g/dL (ref 13.0–17.0)
LYMPHS PCT: 43 % (ref 12–46)
Lymphs Abs: 2.6 10*3/uL (ref 0.7–4.0)
MCH: 32.8 pg (ref 26.0–34.0)
MCHC: 34.3 g/dL (ref 30.0–36.0)
MCV: 95.5 fL (ref 78.0–100.0)
MONO ABS: 0.4 10*3/uL (ref 0.1–1.0)
Monocytes Relative: 7 % (ref 3–12)
NEUTROS PCT: 47 % (ref 43–77)
Neutro Abs: 2.8 10*3/uL (ref 1.7–7.7)
Platelets: ADEQUATE 10*3/uL (ref 150–400)
RBC: 4 MIL/uL — AB (ref 4.22–5.81)
RDW: 14 % (ref 11.5–15.5)
SMEAR REVIEW: ADEQUATE
WBC: 6 10*3/uL (ref 4.0–10.5)

## 2014-01-26 LAB — BASIC METABOLIC PANEL
ANION GAP: 12 (ref 5–15)
BUN: 19 mg/dL (ref 6–23)
CHLORIDE: 105 meq/L (ref 96–112)
CO2: 23 meq/L (ref 19–32)
Calcium: 8.8 mg/dL (ref 8.4–10.5)
Creatinine, Ser: 1.1 mg/dL (ref 0.50–1.35)
GFR calc Af Amer: >90 mL/min (ref >90)
GFR calc non Af Amer: 78 mL/min — ABNORMAL LOW (ref >90)
Glucose, Bld: 89 mg/dL (ref 70–99)
Potassium: 3.8 mEq/L (ref 3.7–5.3)
SODIUM: 140 meq/L (ref 137–147)

## 2014-01-26 LAB — URINALYSIS, ROUTINE W REFLEX MICROSCOPIC
BILIRUBIN URINE: NEGATIVE
GLUCOSE, UA: NEGATIVE mg/dL
Ketones, ur: NEGATIVE mg/dL
Leukocytes, UA: NEGATIVE
NITRITE: NEGATIVE
PH: 5.5 (ref 5.0–8.0)
Urobilinogen, UA: 0.2 mg/dL (ref 0.0–1.0)

## 2014-01-26 MED ORDER — KETOROLAC TROMETHAMINE 30 MG/ML IJ SOLN
30.0000 mg | Freq: Once | INTRAMUSCULAR | Status: AC
  Administered 2014-01-26: 30 mg via INTRAVENOUS
  Filled 2014-01-26: qty 1

## 2014-01-26 MED ORDER — OXYCODONE-ACETAMINOPHEN 5-325 MG PO TABS
2.0000 | ORAL_TABLET | Freq: Once | ORAL | Status: AC
  Administered 2014-01-26: 2 via ORAL
  Filled 2014-01-26: qty 2

## 2014-01-26 MED ORDER — OXYCODONE-ACETAMINOPHEN 5-325 MG PO TABS: 1.0000 | ORAL_TABLET | ORAL | Status: DC | PRN

## 2014-01-26 MED ORDER — SODIUM CHLORIDE 0.9 % IV BOLUS (SEPSIS)
1000.0000 mL | Freq: Once | INTRAVENOUS | Status: AC
  Administered 2014-01-26: 1000 mL via INTRAVENOUS

## 2014-01-26 MED ORDER — MORPHINE SULFATE 4 MG/ML IJ SOLN
4.0000 mg | Freq: Once | INTRAMUSCULAR | Status: AC
  Administered 2014-01-26: 4 mg via INTRAVENOUS
  Filled 2014-01-26: qty 1

## 2014-01-26 MED ORDER — ONDANSETRON HCL 4 MG/2ML IJ SOLN
4.0000 mg | Freq: Once | INTRAMUSCULAR | Status: AC
  Administered 2014-01-26: 4 mg via INTRAVENOUS
  Filled 2014-01-26: qty 2

## 2014-01-26 MED ORDER — TAMSULOSIN HCL 0.4 MG PO CAPS: 0.4000 mg | ORAL_CAPSULE | Freq: Every day | ORAL | Status: DC

## 2014-01-26 MED ORDER — PROMETHAZINE HCL 25 MG PO TABS: 25.0000 mg | ORAL_TABLET | Freq: Four times a day (QID) | ORAL | Status: DC | PRN

## 2014-01-26 NOTE — Discharge Instructions (Signed)
Kidney Stones  Kidney stones (urolithiasis) are deposits that form inside your kidneys. The intense pain is caused by the stone moving through the urinary tract. When the stone moves, the ureter goes into spasm around the stone. The stone is usually passed in the urine.   CAUSES   · A disorder that makes certain neck glands produce too much parathyroid hormone (primary hyperparathyroidism).  · A buildup of uric acid crystals, similar to gout in your joints.  · Narrowing (stricture) of the ureter.  · A kidney obstruction present at birth (congenital obstruction).  · Previous surgery on the kidney or ureters.  · Numerous kidney infections.  SYMPTOMS   · Feeling sick to your stomach (nauseous).  · Throwing up (vomiting).  · Blood in the urine (hematuria).  · Pain that usually spreads (radiates) to the groin.  · Frequency or urgency of urination.  DIAGNOSIS   · Taking a history and physical exam.  · Blood or urine tests.  · CT scan.  · Occasionally, an examination of the inside of the urinary bladder (cystoscopy) is performed.  TREATMENT   · Observation.  · Increasing your fluid intake.  · Extracorporeal shock wave lithotripsy--This is a noninvasive procedure that uses shock waves to break up kidney stones.  · Surgery may be needed if you have severe pain or persistent obstruction. There are various surgical procedures. Most of the procedures are performed with the use of small instruments. Only small incisions are needed to accommodate these instruments, so recovery time is minimized.  The size, location, and chemical composition are all important variables that will determine the proper choice of action for you. Talk to your health care provider to better understand your situation so that you will minimize the risk of injury to yourself and your kidney.   HOME CARE INSTRUCTIONS   · Drink enough water and fluids to keep your urine clear or pale yellow. This will help you to pass the stone or stone fragments.  · Strain  all urine through the provided strainer. Keep all particulate matter and stones for your health care provider to see. The stone causing the pain may be as small as a grain of salt. It is very important to use the strainer each and every time you pass your urine. The collection of your stone will allow your health care provider to analyze it and verify that a stone has actually passed. The stone analysis will often identify what you can do to reduce the incidence of recurrences.  · Only take over-the-counter or prescription medicines for pain, discomfort, or fever as directed by your health care provider.  · Make a follow-up appointment with your health care provider as directed.  · Get follow-up X-rays if required. The absence of pain does not always mean that the stone has passed. It may have only stopped moving. If the urine remains completely obstructed, it can cause loss of kidney function or even complete destruction of the kidney. It is your responsibility to make sure X-rays and follow-ups are completed. Ultrasounds of the kidney can show blockages and the status of the kidney. Ultrasounds are not associated with any radiation and can be performed easily in a matter of minutes.  SEEK MEDICAL CARE IF:  · You experience pain that is progressive and unresponsive to any pain medicine you have been prescribed.  SEEK IMMEDIATE MEDICAL CARE IF:   · Pain cannot be controlled with the prescribed medicine.  · You have a fever or   shaking chills.  · The severity or intensity of pain increases over 18 hours and is not relieved by pain medicine.  · You develop a new onset of abdominal pain.  · You feel faint or pass out.  · You are unable to urinate.  MAKE SURE YOU:   · Understand these instructions.  · Will watch your condition.  · Will get help right away if you are not doing well or get worse.  Document Released: 07/01/2005 Document Revised: 03/03/2013 Document Reviewed: 12/02/2012  ExitCare® Patient Information ©2015  [employer]. This information is not intended to replace advice given to you by your health care provider. Make sure you discuss any questions you have with your health care provider.    Dietary Guidelines to Help Prevent Kidney Stones  Your risk of kidney stones can be decreased by adjusting the foods you eat. The most important thing you can do is drink enough fluid. You should drink enough fluid to keep your urine clear or pale yellow. The following guidelines provide specific information for the type of kidney stone you have had.  GUIDELINES ACCORDING TO TYPE OF KIDNEY STONE  Calcium Oxalate Kidney Stones  · Reduce the amount of salt you eat. Foods that have a lot of salt cause your body to release excess calcium into your urine. The excess calcium can combine with a substance called oxalate to form kidney stones.  · Reduce the amount of animal protein you eat if the amount you eat is excessive. Animal protein causes your body to release excess calcium into your urine. Ask your dietitian how much protein from animal sources you should be eating.  · Avoid foods that are high in oxalates. If you take vitamins, they should have less than 500 mg of vitamin C. Your body turns vitamin C into oxalates. You do not need to avoid fruits and vegetables high in vitamin C.  Calcium Phosphate Kidney Stones  · Reduce the amount of salt you eat to help prevent the release of excess calcium into your urine.  · Reduce the amount of animal protein you eat if the amount you eat is excessive. Animal protein causes your body to release excess calcium into your urine. Ask your dietitian how much protein from animal sources you should be eating.  · Get enough calcium from food or take a calcium supplement (ask your dietitian for recommendations). Food sources of calcium that do not increase your risk of kidney stones include:  ¨ Broccoli.  ¨ Dairy products, such as cheese and yogurt.  ¨ Pudding.  Uric Acid Kidney Stones  · Do not  have more than 6 oz of animal protein per day.  FOOD SOURCES  Animal Protein Sources  · Meat (all types).  · Poultry.  · Eggs.  · Fish, seafood.  Foods High in Salt  · Salt seasonings.  · Soy sauce.  · Teriyaki sauce.  · Cured and processed meats.  · Salted crackers and snack foods.  · Fast food.  · Canned soups and most canned foods.  Foods High in Oxalates  · Grains:  ¨ Amaranth.  ¨ Barley.  ¨ Grits.  ¨ Wheat germ.  ¨ Bran.  ¨ Buckwheat flour.  ¨ All bran cereals.  ¨ Pretzels.  ¨ Whole wheat bread.  · Vegetables:  ¨ Beans (wax).  ¨ Beets and beet greens.  ¨ Collard greens.  ¨ Eggplant.  ¨ Escarole.  ¨ Leeks.  ¨ Okra.  ¨ Parsley.  ¨ Rutabagas.  ¨   Spinach.  ¨ Swiss chard.  ¨ Tomato paste.  ¨ Fried potatoes.  ¨ Sweet potatoes.  · Fruits:  ¨ Red currants.  ¨ Figs.  ¨ Kiwi.  ¨ Rhubarb.  · Meat and Other Protein Sources:  ¨ Beans (dried).  ¨ Soy burgers and other soybean products.  ¨ Miso.  ¨ Nuts (peanuts, almonds, pecans, cashews, hazelnuts).  ¨ Nut butters.  ¨ Sesame seeds and tahini (paste made of sesame seeds).  ¨ Poppy seeds.  · Beverages:  ¨ Chocolate drink mixes.  ¨ Soy milk.  ¨ Instant iced tea.  ¨ Juices made from high-oxalate fruits or vegetables.  · Other:  ¨ Carob.  ¨ Chocolate.  ¨ Fruitcake.  ¨ Marmalades.  Document Released: 10/26/2010 Document Revised: 07/06/2013 Document Reviewed: 05/28/2013  ExitCare® Patient Information ©2015 [employer]. This information is not intended to replace advice given to you by your health care provider. Make sure you discuss any questions you have with your health care provider.

## 2014-01-26 NOTE — ED Provider Notes (Signed)
TIME SEEN: 8:25 PM  CHIEF COMPLAINT: Left-sided flank pain  HPI: Patient is a 48 year old male with history of bipolar disorder, schizophrenia, prior history of kidney stones who presents to the emergency Department left-sided flank pain that started at 2:30 PM today with associated nausea. He describes as a sharp and stabbing pain without radiation, no aggravating or relieving factors. He states this feels exactly like his prior kidney stones. He has never had to have lithotripsy, stent placement, basket retrieval or any other urologic procedures. He states he feels that his urine stream has been slightly weaker than normal but he has been urinating. No urinary retention. He is not passing blood clots. No fevers, chills, vomiting or diarrhea.  ROS: See HPI Constitutional: no fever  Eyes: no drainage  ENT: no runny nose   Cardiovascular:  no chest pain  Resp: no SOB  GI: no vomiting GU: no dysuria Integumentary: no rash  Allergy: no hives  Musculoskeletal: no leg swelling  Neurological: no slurred speech ROS otherwise negative  PAST MEDICAL HISTORY/PAST SURGICAL HISTORY:  Past Medical History  Diagnosis Date  . Bipolar 1 disorder   . Intermittent explosive disorder   . Schizophrenia   . Depression   . Bronchitis     hx  . History of kidney stones   . Open wound of right middle finger without damage to nail     MEDICATIONS:  Prior to Admission medications   Medication Sig Start Date End Date Taking? Authorizing Provider  acetaminophen (TYLENOL) 500 MG tablet Take 1,000 mg by mouth 3 (three) times daily as needed. For pain   Yes Historical Provider, MD  ALPRAZolam Prudy Feeler) 0.5 MG tablet Take 0.5 mg by mouth 4 (four) times daily as needed. For anxiety   Yes Historical Provider, MD  busPIRone (BUSPAR) 10 MG tablet Take 30 mg by mouth 2 (two) times daily.   Yes Historical Provider, MD  citalopram (CELEXA) 20 MG tablet Take 20 mg by mouth daily.   Yes Historical Provider, MD   divalproex (DEPAKOTE ER) 250 MG 24 hr tablet Take 250 mg by mouth daily. At 5pm takes with 500mg  for a total of 1750mg    Yes Historical Provider, MD  divalproex (DEPAKOTE ER) 500 MG 24 hr tablet Take 1,500 mg by mouth daily. At 5pm   Yes Historical Provider, MD    ALLERGIES:  Allergies  Allergen Reactions  . Aspirin Other (See Comments)    Upset stomach  . Ibuprofen     Stomach upset  . Tramadol Nausea And Vomiting    SOCIAL HISTORY:  History  Substance Use Topics  . Smoking status: Current Every Day Smoker -- 1.00 packs/day    Types: Cigarettes  . Smokeless tobacco: Not on file  . Alcohol Use: No    FAMILY HISTORY: No family history on file.  EXAM: BP 130/78  Pulse 80  Temp(Src) 99.3 F (37.4 C) (Oral)  Resp 20  Ht 5\' 11"  (1.803 m)  Wt 195 lb (88.451 kg)  BMI 27.21 kg/m2  SpO2 100% CONSTITUTIONAL: Alert and oriented and responds appropriately to questions. Well-appearing; well-nourished HEAD: Normocephalic EYES: Conjunctivae clear, PERRL ENT: normal nose; no rhinorrhea; moist mucous membranes; pharynx without lesions noted NECK: Supple, no meningismus, no LAD  CARD: RRR; S1 and S2 appreciated; no murmurs, no clicks, no rubs, no gallops RESP: Normal chest excursion without splinting or tachypnea; breath sounds clear and equal bilaterally; no wheezes, no rhonchi, no rales,  ABD/GI: Normal bowel sounds; non-distended; soft, non-tender, mild tenderness  to palpation of her left flank, no guarding or rebound, no peritoneal signs BACK:  The back appears normal and is non-tender to palpation, there is no CVA tenderness EXT: Normal ROM in all joints; non-tender to palpation; no edema; normal capillary refill; no cyanosis    SKIN: Normal color for age and race; warm NEURO: Moves all extremities equally PSYCH: The patient's mood and manner are appropriate. Grooming and personal hygiene are appropriate.  MEDICAL DECISION MAKING: Patient here with complaints of pain that is  similar to his prior episodes of kidney stones. He had a CT scan here in 2009 a confirmed a kidney stone. I do not feel he needs repeat imaging today. Will obtain labs, urinalysis. We'll give IV fluids, pain and nausea medicine.  ED PROGRESS: Patient's urine shows large hemoglobin, few bacteria and squamous cells. No other sign of infection. Culture pending. Otherwise labs are unremarkable. I feel he is safe to be discharged home with urology followup. We'll discharge with pain medication, nausea medicine and Flomax. We'll give urology followup information. Discussed strict return precautions and supportive care instructions. Patient verbalizes understanding and is comfortable with plan.     Layla MawKristen N Ward, DO 01/26/14 2157

## 2014-01-26 NOTE — ED Notes (Signed)
Patient c/o left flank pain with nausea x 5 hours.

## 2014-01-28 LAB — URINE CULTURE
Colony Count: NO GROWTH
Culture: NO GROWTH

## 2014-02-11 ENCOUNTER — Encounter (HOSPITAL_COMMUNITY): Payer: Self-pay | Admitting: Emergency Medicine

## 2014-02-11 ENCOUNTER — Emergency Department (HOSPITAL_COMMUNITY)
Admission: EM | Admit: 2014-02-11 | Discharge: 2014-02-11 | Disposition: A | Discharge status: Home / Self Care | Payer: Self-pay | Attending: Emergency Medicine | Admitting: Emergency Medicine

## 2014-02-11 ENCOUNTER — Emergency Department (HOSPITAL_COMMUNITY): Payer: Self-pay

## 2014-02-11 DIAGNOSIS — Z87442 Personal history of urinary calculi: Secondary | ICD-10-CM | POA: Insufficient documentation

## 2014-02-11 DIAGNOSIS — Z79899 Other long term (current) drug therapy: Secondary | ICD-10-CM | POA: Insufficient documentation

## 2014-02-11 DIAGNOSIS — Z8669 Personal history of other diseases of the nervous system and sense organs: Secondary | ICD-10-CM | POA: Insufficient documentation

## 2014-02-11 DIAGNOSIS — R109 Unspecified abdominal pain: Secondary | ICD-10-CM | POA: Insufficient documentation

## 2014-02-11 DIAGNOSIS — Z87828 Personal history of other (healed) physical injury and trauma: Secondary | ICD-10-CM | POA: Insufficient documentation

## 2014-02-11 DIAGNOSIS — R110 Nausea: Secondary | ICD-10-CM | POA: Insufficient documentation

## 2014-02-11 DIAGNOSIS — F319 Bipolar disorder, unspecified: Secondary | ICD-10-CM | POA: Insufficient documentation

## 2014-02-11 DIAGNOSIS — F17200 Nicotine dependence, unspecified, uncomplicated: Secondary | ICD-10-CM | POA: Insufficient documentation

## 2014-02-11 DIAGNOSIS — R319 Hematuria, unspecified: Secondary | ICD-10-CM | POA: Insufficient documentation

## 2014-02-11 DIAGNOSIS — Z8709 Personal history of other diseases of the respiratory system: Secondary | ICD-10-CM | POA: Insufficient documentation

## 2014-02-11 LAB — CBC WITH DIFFERENTIAL/PLATELET
BASOS PCT: 1 % (ref 0–1)
Basophils Absolute: 0.1 10*3/uL (ref 0.0–0.1)
Eosinophils Absolute: 0.1 10*3/uL (ref 0.0–0.7)
Eosinophils Relative: 2 % (ref 0–5)
HCT: 38 % — ABNORMAL LOW (ref 39.0–52.0)
HEMOGLOBIN: 13.1 g/dL (ref 13.0–17.0)
LYMPHS ABS: 2.3 10*3/uL (ref 0.7–4.0)
Lymphocytes Relative: 39 % (ref 12–46)
MCH: 33.2 pg (ref 26.0–34.0)
MCHC: 34.5 g/dL (ref 30.0–36.0)
MCV: 96.4 fL (ref 78.0–100.0)
MONOS PCT: 11 % (ref 3–12)
Monocytes Absolute: 0.6 10*3/uL (ref 0.1–1.0)
NEUTROS ABS: 2.8 10*3/uL (ref 1.7–7.7)
NEUTROS PCT: 47 % (ref 43–77)
Platelets: 182 10*3/uL (ref 150–400)
RBC: 3.94 MIL/uL — AB (ref 4.22–5.81)
RDW: 13.8 % (ref 11.5–15.5)
WBC: 5.9 10*3/uL (ref 4.0–10.5)

## 2014-02-11 LAB — COMPREHENSIVE METABOLIC PANEL
ALBUMIN: 3.7 g/dL (ref 3.5–5.2)
ALK PHOS: 44 U/L (ref 39–117)
ALT: 9 U/L (ref 0–53)
ANION GAP: 14 (ref 5–15)
AST: 16 U/L (ref 0–37)
BUN: 22 mg/dL (ref 6–23)
CHLORIDE: 105 meq/L (ref 96–112)
CO2: 22 mEq/L (ref 19–32)
Calcium: 9 mg/dL (ref 8.4–10.5)
Creatinine, Ser: 0.88 mg/dL (ref 0.50–1.35)
GFR calc Af Amer: >90 mL/min (ref >90)
GFR calc non Af Amer: >90 mL/min (ref >90)
Glucose, Bld: 91 mg/dL (ref 70–99)
POTASSIUM: 3.6 meq/L — AB (ref 3.7–5.3)
Sodium: 141 mEq/L (ref 137–147)
Total Protein: 6.9 g/dL (ref 6.0–8.3)

## 2014-02-11 LAB — URINALYSIS, ROUTINE W REFLEX MICROSCOPIC
Glucose, UA: NEGATIVE mg/dL
Hgb urine dipstick: NEGATIVE
LEUKOCYTES UA: NEGATIVE
NITRITE: NEGATIVE
PH: 5 (ref 5.0–8.0)
Protein, ur: NEGATIVE mg/dL
SPECIFIC GRAVITY, URINE: 1.025 (ref 1.005–1.030)
Urobilinogen, UA: 0.2 mg/dL (ref 0.0–1.0)

## 2014-02-11 LAB — LIPASE, BLOOD: Lipase: 25 U/L (ref 11–59)

## 2014-02-11 MED ORDER — MORPHINE SULFATE 4 MG/ML IJ SOLN
4.0000 mg | Freq: Once | INTRAMUSCULAR | Status: AC
  Administered 2014-02-11: 4 mg via INTRAVENOUS
  Filled 2014-02-11: qty 1

## 2014-02-11 MED ORDER — IOHEXOL 300 MG/ML  SOLN
50.0000 mL | Freq: Once | INTRAMUSCULAR | Status: AC | PRN
  Administered 2014-02-11: 50 mL via ORAL

## 2014-02-11 MED ORDER — SODIUM CHLORIDE 0.9 % IV BOLUS (SEPSIS)
1000.0000 mL | Freq: Once | INTRAVENOUS | Status: AC
  Administered 2014-02-11: 1000 mL via INTRAVENOUS

## 2014-02-11 MED ORDER — ONDANSETRON HCL 4 MG/2ML IJ SOLN
4.0000 mg | Freq: Once | INTRAMUSCULAR | Status: AC
  Administered 2014-02-11: 4 mg via INTRAVENOUS
  Filled 2014-02-11: qty 2

## 2014-02-11 MED ORDER — IOHEXOL 300 MG/ML  SOLN
100.0000 mL | Freq: Once | INTRAMUSCULAR | Status: AC | PRN
  Administered 2014-02-11: 100 mL via INTRAVENOUS

## 2014-02-11 MED ORDER — ONDANSETRON HCL 4 MG PO TABS: 4.0000 mg | ORAL_TABLET | Freq: Four times a day (QID) | ORAL | Status: DC

## 2014-02-11 MED ORDER — OXYCODONE-ACETAMINOPHEN 5-325 MG PO TABS: 2.0000 | ORAL_TABLET | ORAL | Status: DC | PRN

## 2014-02-11 NOTE — Discharge Instructions (Signed)

## 2014-02-11 NOTE — ED Provider Notes (Signed)
CSN: [id number]     Arrival date & time 02/11/14  1930 History   First MD Initiated Contact with Patient 02/11/14 1948     Chief Complaint  Patient presents with  . right side pain      (Consider location/radiation/quality/duration/timing/severity/associated sxs/prior Treatment) HPI Comments: Patient presents with right-sided abdominal pain ongoing for the past day. The pain is sharp and stabbing coming and going lasting 5-10 seconds at a time. There is some soreness and don't aching pain in the right side of his abdomen. This feels similar to previous kidney stones. He denies any flank pain, vomiting or diarrhea. No fever. Denies any testicular pain. He feels he has seen some blood in his urine. Notably he was seen in ED on July 15 4 hematuria and left-sided flank pain which has improved. No previous abdominal surgeries. He has never had any stone procedures.  The history is provided by the patient.    Past Medical History  Diagnosis Date  . Bipolar 1 disorder   . Intermittent explosive disorder   . Schizophrenia   . Depression   . Bronchitis     hx  . History of kidney stones   . Open wound of right middle finger without damage to nail    Past Surgical History  Procedure Laterality Date  . Bunionectomy    . Shoulder surgery    . Vasectomy    . I&d extremity  10/30/2011    Procedure: IRRIGATION AND DEBRIDEMENT EXTREMITY;  Surgeon: Sharma Covert, MD;  Location: [facility] OR;  Service: Orthopedics;  Laterality: Right;  Wound exploration right middle finger with repair   History reviewed. No pertinent family history. History  Substance Use Topics  . Smoking status: Current Every Day Smoker -- 1.00 packs/day    Types: Cigarettes  . Smokeless tobacco: Not on file  . Alcohol Use: No    Review of Systems  Constitutional: Negative for fever, activity change and appetite change.  HENT: Negative for congestion and rhinorrhea.   Respiratory: Negative for cough, chest tightness and  shortness of breath.   Gastrointestinal: Positive for nausea and abdominal pain. Negative for vomiting.  Genitourinary: Positive for hematuria. Negative for dysuria and urgency.  Musculoskeletal: Negative for arthralgias, myalgias and neck pain.  Skin: Negative for rash.  Neurological: Negative for dizziness, weakness and headaches.  A complete 10 system review of systems was obtained and all systems are negative except as noted in the HPI and PMH.      Allergies  Aspirin; Ibuprofen; and Tramadol  Home Medications   Prior to Admission medications   Medication Sig Start Date End Date Taking? Authorizing Provider  acetaminophen (TYLENOL) 500 MG tablet Take 1,000 mg by mouth 3 (three) times daily as needed. For pain   Yes Historical Provider, MD  ALPRAZolam Prudy Feeler) 0.5 MG tablet Take 0.5 mg by mouth 4 (four) times daily as needed. For anxiety   Yes Historical Provider, MD  busPIRone (BUSPAR) 10 MG tablet Take 30 mg by mouth 2 (two) times daily.   Yes Historical Provider, MD  citalopram (CELEXA) 20 MG tablet Take 20 mg by mouth daily.   Yes Historical Provider, MD  divalproex (DEPAKOTE ER) 250 MG 24 hr tablet Take 250 mg by mouth daily. At 5pm takes with 500mg  for a total of 1750mg    Yes Historical Provider, MD  divalproex (DEPAKOTE ER) 500 MG 24 hr tablet Take 1,500 mg by mouth daily. At 5pm   Yes Historical Provider, MD  oxyCODONE-acetaminophen (PERCOCET/ROXICET)  5-325 MG per tablet Take 1-2 tablets by mouth every 4 (four) hours as needed. 01/26/14  Yes Kristen N Ward, DO  ondansetron (ZOFRAN) 4 MG tablet Take 1 tablet (4 mg total) by mouth every 6 (six) hours. 02/11/14   Glynn OctaveStephen Kerie Badger, MD  oxyCODONE-acetaminophen (PERCOCET/ROXICET) 5-325 MG per tablet Take 2 tablets by mouth every 4 (four) hours as needed for severe pain. 02/11/14   Glynn OctaveStephen Roux Brandy, MD  promethazine (PHENERGAN) 25 MG tablet Take 1 tablet (25 mg total) by mouth every 6 (six) hours as needed for nausea or vomiting. 01/26/14    Kristen N Ward, DO   BP 119/89  Temp(Src) 99.3 F (37.4 C) (Oral)  Ht 5\' 11"  (1.803 m)  Wt 201 lb (91.173 kg)  BMI 28.05 kg/m2  SpO2 100% Physical Exam  Nursing note and vitals reviewed. Constitutional: He is oriented to person, place, and time. He appears well-developed and well-nourished. No distress.  HENT:  Head: Normocephalic and atraumatic.  Mouth/Throat: Oropharynx is clear and moist. No oropharyngeal exudate.  Eyes: Conjunctivae and EOM are normal. Pupils are equal, round, and reactive to light.  Neck: Normal range of motion. Neck supple.  No meningismus.  Cardiovascular: Normal rate, regular rhythm, normal heart sounds and intact distal pulses.   No murmur heard. Pulmonary/Chest: Effort normal and breath sounds normal. No respiratory distress.  Abdominal: Soft. There is tenderness. There is no rebound and no guarding.  Right sided abdominal tenderness, no guarding or rebound.  Genitourinary:  No testicular tenderness  Musculoskeletal: Normal range of motion. He exhibits no edema and no tenderness.  No CVA tenderness  Neurological: He is alert and oriented to person, place, and time. No cranial nerve deficit. He exhibits normal muscle tone. Coordination normal.  No ataxia on finger to nose bilaterally. No pronator drift. 5/5 strength throughout. CN 2-12 intact. Negative Romberg. Equal grip strength. Sensation intact. Gait is normal.   Skin: Skin is warm.  Psychiatric: He has a normal mood and affect. His behavior is normal.    ED Course  Procedures (including critical care time) Labs Review Labs Reviewed  URINALYSIS, ROUTINE W REFLEX MICROSCOPIC - Abnormal; Notable for the following:    Bilirubin Urine SMALL (*)    Ketones, ur TRACE (*)    All other components within normal limits  CBC WITH DIFFERENTIAL - Abnormal; Notable for the following:    RBC 3.94 (*)    HCT 38.0 (*)    All other components within normal limits  COMPREHENSIVE METABOLIC PANEL - Abnormal;  Notable for the following:    Potassium 3.6 (*)    Total Bilirubin <0.2 (*)    All other components within normal limits  LIPASE, BLOOD    Imaging Review Ct Abdomen Pelvis W Contrast  02/11/2014   CLINICAL DATA:  Right lower quadrant and right flank pain since yesterday. History of renal stones.  EXAM: CT ABDOMEN AND PELVIS WITH CONTRAST  TECHNIQUE: Multidetector CT imaging of the abdomen and pelvis was performed using the standard protocol following bolus administration of intravenous contrast.  CONTRAST:  50mL OMNIPAQUE IOHEXOL 300 MG/ML SOLN, 100mL OMNIPAQUE IOHEXOL 300 MG/ML SOLN  COMPARISON:  02/01/2008  FINDINGS: The lung bases are clear.  The liver, spleen, gallbladder pancreas, adrenal glands, abdominal aorta, inferior vena cava, and retroperitoneal lymph nodes are unremarkable. Multiple stones are demonstrated in both kidneys, largest on the right measures 5 mm diameter and largest on the left measures 4 mm diameter. The kidneys are symmetrical in size and shape. Nephrograms are  symmetrical. No hydronephrosis or hydroureter. No ureteral or bladder stones identified. Bladder wall is not thickened.  The stomach, small bowel, and colon are normal for degree of distention. Stool fills the colon. No free air or free fluid in the abdomen.  Pelvis: The appendix is normal. Scattered diverticula in the sigmoid colon without evidence of diverticulitis. Prostate gland is not enlarged. No pelvic mass or lymphadenopathy. No free or loculated pelvic fluid collections. No destructive bone lesions. Focal sclerosis in the right ilium adjacent to the SI joints is unchanged since previous study and likely benign.  IMPRESSION: Multiple nonobstructing stones in both kidneys.   Electronically Signed   By: Burman Nieves M.D.   On: 02/11/2014 21:25     EKG Interpretation None      MDM   Final diagnoses:  Abdominal pain, unspecified abdominal location   Right-sided abdominal pain similar to previous kidney  stone. No flank pain, fever or vomiting. Urinalysis shows no hematuria.  Urinalysis with no evidence of infection. Labs otherwise unremarkable. CT scan shows normal appendix. Multiple stones seen in kidneys. No obstructive uropathy.  Pain controlled in the ED. No vomiting. Suspect possibly passed the stone. Stable for outpatient treatment with pain medication, nausea medication and outpatient followup.  Glynn Octave, MD 02/12/14 [phone]

## 2014-02-11 NOTE — ED Notes (Signed)
Pt passed fluid challenge drink 1/2 can of 7.5 oz can of coke w/o any difficulties.

## 2014-02-11 NOTE — ED Notes (Signed)
History  Of kidney stones, pt states pain to right side, similar to kidney stone pain of past.

## 2014-05-11 ENCOUNTER — Encounter (HOSPITAL_COMMUNITY): Payer: Self-pay | Admitting: Emergency Medicine

## 2014-05-11 ENCOUNTER — Emergency Department (HOSPITAL_COMMUNITY): Payer: Self-pay

## 2014-05-11 ENCOUNTER — Emergency Department (HOSPITAL_COMMUNITY)
Admission: EM | Admit: 2014-05-11 | Discharge: 2014-05-12 | Disposition: A | Discharge status: Home / Self Care | Payer: Self-pay | Attending: Emergency Medicine | Admitting: Emergency Medicine

## 2014-05-11 DIAGNOSIS — R109 Unspecified abdominal pain: Secondary | ICD-10-CM | POA: Insufficient documentation

## 2014-05-11 DIAGNOSIS — Z87828 Personal history of other (healed) physical injury and trauma: Secondary | ICD-10-CM | POA: Insufficient documentation

## 2014-05-11 DIAGNOSIS — F209 Schizophrenia, unspecified: Secondary | ICD-10-CM | POA: Insufficient documentation

## 2014-05-11 DIAGNOSIS — F319 Bipolar disorder, unspecified: Secondary | ICD-10-CM | POA: Insufficient documentation

## 2014-05-11 DIAGNOSIS — Z79899 Other long term (current) drug therapy: Secondary | ICD-10-CM | POA: Insufficient documentation

## 2014-05-11 DIAGNOSIS — Z720 Tobacco use: Secondary | ICD-10-CM | POA: Insufficient documentation

## 2014-05-11 DIAGNOSIS — Z87442 Personal history of urinary calculi: Secondary | ICD-10-CM | POA: Insufficient documentation

## 2014-05-11 LAB — CBC WITH DIFFERENTIAL/PLATELET
BASOS PCT: 1 % (ref 0–1)
Basophils Absolute: 0.1 10*3/uL (ref 0.0–0.1)
EOS PCT: 4 % (ref 0–5)
Eosinophils Absolute: 0.3 10*3/uL (ref 0.0–0.7)
HEMATOCRIT: 35.7 % — AB (ref 39.0–52.0)
Hemoglobin: 12.3 g/dL — ABNORMAL LOW (ref 13.0–17.0)
Lymphocytes Relative: 39 % (ref 12–46)
Lymphs Abs: 2.6 10*3/uL (ref 0.7–4.0)
MCH: 32.6 pg (ref 26.0–34.0)
MCHC: 34.5 g/dL (ref 30.0–36.0)
MCV: 94.7 fL (ref 78.0–100.0)
MONO ABS: 0.4 10*3/uL (ref 0.1–1.0)
Monocytes Relative: 6 % (ref 3–12)
Neutro Abs: 3.3 10*3/uL (ref 1.7–7.7)
Neutrophils Relative %: 50 % (ref 43–77)
Platelets: 194 10*3/uL (ref 150–400)
RBC: 3.77 MIL/uL — ABNORMAL LOW (ref 4.22–5.81)
RDW: 13.5 % (ref 11.5–15.5)
WBC: 6.7 10*3/uL (ref 4.0–10.5)

## 2014-05-11 LAB — URINALYSIS, ROUTINE W REFLEX MICROSCOPIC
Bilirubin Urine: NEGATIVE
Glucose, UA: NEGATIVE mg/dL
Hgb urine dipstick: NEGATIVE
Ketones, ur: NEGATIVE mg/dL
LEUKOCYTES UA: NEGATIVE
Nitrite: NEGATIVE
PH: 5.5 (ref 5.0–8.0)
PROTEIN: NEGATIVE mg/dL
Specific Gravity, Urine: >1.03 — ABNORMAL HIGH (ref 1.005–1.030)
Urobilinogen, UA: 0.2 mg/dL (ref 0.0–1.0)

## 2014-05-11 MED ORDER — HYDROMORPHONE HCL 1 MG/ML IJ SOLN
1.0000 mg | Freq: Once | INTRAMUSCULAR | Status: AC
  Administered 2014-05-11: 1 mg via INTRAVENOUS
  Filled 2014-05-11: qty 1

## 2014-05-11 MED ORDER — ONDANSETRON HCL 4 MG/2ML IJ SOLN
4.0000 mg | Freq: Once | INTRAMUSCULAR | Status: AC
  Administered 2014-05-11: 4 mg via INTRAVENOUS
  Filled 2014-05-11: qty 2

## 2014-05-11 NOTE — ED Provider Notes (Signed)
CSN: [id number]     Arrival date & time 05/11/14  2105 History   First MD Initiated Contact with Patient 05/11/14 2236     Chief Complaint  Patient presents with  . Flank Pain     (Consider location/radiation/quality/duration/timing/severity/associated sxs/prior Treatment) HPI   Luke Craig is a 48 y.o. male who presents to the Emergency Department complaining of right flank pain that began suddenly this morning.  He describes a sharp pain that is waxing in waning and moving from his flank to his back.  He also reports having dark colored urine today.  He reports h/o kidney stones and states the pain today feels similar to previous episodes.  He states that nothing helps the pain.  He has tried tylenol without relief. He denies nausea or vomiting, fever, blood in his urine or abdominal pain.     Past Medical History  Diagnosis Date  . Bipolar 1 disorder   . Intermittent explosive disorder   . Schizophrenia   . Depression   . Bronchitis     hx  . History of kidney stones   . Open wound of right middle finger without damage to nail    Past Surgical History  Procedure Laterality Date  . Bunionectomy    . Shoulder surgery    . Vasectomy    . I&d extremity  10/30/2011    Procedure: IRRIGATION AND DEBRIDEMENT EXTREMITY;  Surgeon: Sharma CovertFred W Ortmann, MD;  [facility] OR;  Service: Orthopedics;  Laterality: Right;  Wound exploration right middle finger with repair   History reviewed. No pertinent family history. History  Substance Use Topics  . Smoking status: Current Every Day Smoker -- 1.00 packs/day    Types: Cigarettes  . Smokeless tobacco: Not on file  . Alcohol Use: No    Review of Systems  Constitutional: Negative for fever, chills and appetite change.  Respiratory: Negative for chest tightness and shortness of breath.   Cardiovascular: Negative for chest pain.  Gastrointestinal: Negative for nausea, vomiting, abdominal pain and blood in stool.  Genitourinary:  Positive for flank pain. Negative for dysuria, hematuria, decreased urine volume, discharge, penile swelling, scrotal swelling, difficulty urinating and testicular pain.  Musculoskeletal: Negative for back pain.  Skin: Negative for color change and rash.  Neurological: Negative for dizziness, weakness and numbness.  Hematological: Negative for adenopathy.  All other systems reviewed and are negative.     Allergies  Aspirin; Ibuprofen; and Tramadol  Home Medications   Prior to Admission medications   Medication Sig Start Date End Date Taking? Authorizing Provider  acetaminophen (TYLENOL) 500 MG tablet Take 1,000 mg by mouth 3 (three) times daily as needed. For pain    Historical Provider, MD  ALPRAZolam Prudy Feeler(XANAX) 0.5 MG tablet Take 0.5 mg by mouth 4 (four) times daily as needed. For anxiety    Historical Provider, MD  busPIRone (BUSPAR) 10 MG tablet Take 30 mg by mouth 2 (two) times daily.    Historical Provider, MD  citalopram (CELEXA) 20 MG tablet Take 20 mg by mouth daily.    Historical Provider, MD  divalproex (DEPAKOTE ER) 250 MG 24 hr tablet Take 250 mg by mouth daily. At 5pm takes with 500mg  for a total of 1750mg     Historical Provider, MD  divalproex (DEPAKOTE ER) 500 MG 24 hr tablet Take 1,500 mg by mouth daily. At 5pm    Historical Provider, MD  ondansetron (ZOFRAN) 4 MG tablet Take 1 tablet (4 mg total) by mouth every 6 (six)  hours. 02/11/14   Glynn Octave, MD  oxyCODONE-acetaminophen (PERCOCET/ROXICET) 5-325 MG per tablet Take 1-2 tablets by mouth every 4 (four) hours as needed. 01/26/14   Kristen N Ward, DO  oxyCODONE-acetaminophen (PERCOCET/ROXICET) 5-325 MG per tablet Take 2 tablets by mouth every 4 (four) hours as needed for severe pain. 02/11/14   Glynn Octave, MD  promethazine (PHENERGAN) 25 MG tablet Take 1 tablet (25 mg total) by mouth every 6 (six) hours as needed for nausea or vomiting. 01/26/14   Kristen N Ward, DO   BP 141/79  Pulse 74  Temp(Src) 98.4 F (36.9 C)  (Oral)  Resp 20  Ht 5\' 11"  (1.803 m)  Wt 209 lb (94.802 kg)  BMI 29.16 kg/m2  SpO2 100% Physical Exam  Nursing note and vitals reviewed. Constitutional: He is oriented to person, place, and time. He appears well-developed and well-nourished. No distress.  HENT:  Head: Normocephalic and atraumatic.  Mouth/Throat: Oropharynx is clear and moist.  Cardiovascular: Normal rate, regular rhythm, normal heart sounds and intact distal pulses.   No murmur heard. Pulmonary/Chest: Effort normal and breath sounds normal. No respiratory distress.  Abdominal: Soft. Normal appearance and bowel sounds are normal. He exhibits no distension and no mass. There is tenderness. There is no rigidity, no rebound, no guarding, no CVA tenderness and no tenderness at McBurney's point.  Patient points to area of the right flank as source of pain.  On exam, abdomen is soft, non-tender,no guarding or rebound  Musculoskeletal: Normal range of motion. He exhibits no edema.  Neurological: He is alert and oriented to person, place, and time. He exhibits normal muscle tone. Coordination normal.  Skin: Skin is warm and dry.    ED Course  Procedures (including critical care time) Labs Review Labs Reviewed  URINALYSIS, ROUTINE W REFLEX MICROSCOPIC - Abnormal; Notable for the following:    Specific Gravity, Urine >1.030 (*)    All other components within normal limits  CBC WITH DIFFERENTIAL - Abnormal; Notable for the following:    RBC 3.77 (*)    Hemoglobin 12.3 (*)    HCT 35.7 (*)    All other components within normal limits  BASIC METABOLIC PANEL - Abnormal; Notable for the following:    Glucose, Bld 111 (*)    All other components within normal limits    Imaging Review Ct Renal Stone Study  05/12/2014   CLINICAL DATA:  Initial evaluation for acute right flank pain.  EXAM: CT ABDOMEN AND PELVIS WITHOUT CONTRAST  TECHNIQUE: Multidetector CT imaging of the abdomen and pelvis was performed following the standard  protocol without IV contrast.  COMPARISON:  Prior study from 02/11/2014  FINDINGS: Multiple scattered subcentimeter nodules are seen within the partially visualized lung bases, measuring up to 3 mm. These are nonspecific, but may reflect sequelae of underlying atypical or endobronchial infection.  The liver demonstrates a normal unenhanced appearance. Gallbladder within normal limits. No biliary dilatation. The spleen, adrenal glands, and pancreas demonstrate a normal unenhanced appearance.  Nonobstructive calculi measuring up to 7 mm present within the right kidney. No obstructive stone seen along the course of the right renal collecting system. There is no right-sided hydronephrosis or hydroureter.  Nonobstructive calculi measuring up to 7 mm present within the left kidney. No obstructive stone seen along the course of the left renal collecting system. There is no left-sided hydronephrosis or hydroureter.  Stomach within normal limits. No evidence of bowel obstruction. Appendix well visualized in the right lower quadrant and is of normal  caliber and appearance without associated inflammatory changes to suggest acute appendicitis. No abnormal wall thickening or inflammatory fat stranding seen about the bowels elsewhere.  Bladder within normal limits.  Prostate unremarkable.  No free air or fluid. No pathologically enlarged intra-abdominal pelvic lymph nodes.  No acute osseous abnormality. No worrisome lytic or blastic osseous lesions. Sclerotic changes within the right iliac wing adjacent to the right SI joint are stable.  IMPRESSION: 1. Bilateral nonobstructive nephrolithiasis measuring up to 7 mm within the kidneys bilaterally. No CT evidence for obstructive uropathy. 2. Scattered subcentimeter nodules measuring up to 3 mm within the partially visualized lung bases. These are nonspecific, but may reflect sequelae of underlying mild atypical or endobronchial infection. 3. No other acute intra-abdominal or pelvic  process. 4. Normal appendix.   Electronically Signed   By: Rise MuBenjamin  McClintock M.D.   On: 05/12/2014 01:18     EKG Interpretation None      MDM   Final diagnoses:  Right flank pain    Patient with h/o kidney stones and reports pain tonight as similar.  He is well appearing, non-toxic. No concerning sx's for acute abdomen.  VSS.  Pain improved.  Pt advised of lab results and CT stone study is pending at of end of shift.  Dr. Ranae PalmsYelverton to review scan results and dispo patient.  I have given referral info for [facility] and pt agrees to arrange f/u.  rx for #15 percocet     Buel Molder L. Aydon Swamy, PA-C 05/12/14 1648  Caasi Giglia L. Trisha Mangleriplett, PA-C 05/12/14 1649

## 2014-05-11 NOTE — ED Notes (Signed)
Patient states left flank/lower abd pain. Patient states pain with urination. Patient states he has a history of kidney stones and tonight it "feels the same"

## 2014-05-11 NOTE — ED Notes (Signed)
Pain rt flank, onset this am.  No n/v,

## 2014-05-12 LAB — BASIC METABOLIC PANEL
ANION GAP: 14 (ref 5–15)
BUN: 21 mg/dL (ref 6–23)
CALCIUM: 8.8 mg/dL (ref 8.4–10.5)
CO2: 22 mEq/L (ref 19–32)
CREATININE: 0.78 mg/dL (ref 0.50–1.35)
Chloride: 103 mEq/L (ref 96–112)
GFR calc non Af Amer: >90 mL/min (ref >90)
Glucose, Bld: 111 mg/dL — ABNORMAL HIGH (ref 70–99)
Potassium: 3.8 mEq/L (ref 3.7–5.3)
Sodium: 139 mEq/L (ref 137–147)

## 2014-05-12 MED ORDER — OXYCODONE-ACETAMINOPHEN 5-325 MG PO TABS: 1.0000 | ORAL_TABLET | ORAL | Status: DC | PRN

## 2014-05-12 MED ORDER — OXYCODONE-ACETAMINOPHEN 5-325 MG PO TABS
1.0000 | ORAL_TABLET | Freq: Once | ORAL | Status: AC
  Administered 2014-05-12: 1 via ORAL
  Filled 2014-05-12: qty 1

## 2014-05-12 NOTE — ED Provider Notes (Signed)
Medical screening examination/treatment/procedure(s) were performed by non-physician practitioner and as supervising physician I was immediately available for consultation/collaboration.   EKG Interpretation None        Yandiel Bergum L Kacper Cartlidge, MD 05/12/14 1705 

## 2014-05-12 NOTE — ED Notes (Signed)
Patient verbalizes understanding of discharge instructions, prescription medications, home care and follow up care. Patient ambulatory out of department at this time with family. 

## 2014-05-12 NOTE — Discharge Instructions (Signed)
Flank Pain °Flank pain is pain in your side. The flank is the area of your side between your upper belly (abdomen) and your back. Pain in this area can be caused by many different things. °HOME CARE °Home care and treatment will depend on the cause of your pain. °· Rest as told by your doctor. °· Drink enough fluids to keep your pee (urine) clear or pale yellow.   °· Only take medicine as told by your doctor. °· Tell your doctor about any changes in your pain. °· Follow up with your doctor. °GET HELP RIGHT AWAY IF:  °· Your pain does not get better with medicine.   °· You have new symptoms or your symptoms get worse. °· Your pain gets worse.   °· You have belly (abdominal) pain.   °· You are short of breath.   °· You always feel sick to your stomach (nauseous).   °· You keep throwing up (vomiting).   °· You have puffiness (swelling) in your belly.   °· You feel light-headed or you pass out (faint).   °· You have blood in your pee. °· You have a fever or lasting symptoms for more than 2-3 days. °· You have a fever and your symptoms suddenly get worse. °MAKE SURE YOU:  °· Understand these instructions. °· Will watch your condition. °· Will get help right away if you are not doing well or get worse. °Document Released: 04/09/2008 Document Revised: 11/15/2013 Document Reviewed: 02/13/2012 °[employer]® Patient Information ©2015 [employer]. This information is not intended to replace advice given to you by your health care provider. Make sure you discuss any questions you have with your health care provider. ° °

## 2014-07-08 ENCOUNTER — Emergency Department (HOSPITAL_COMMUNITY): Payer: Self-pay

## 2014-07-08 ENCOUNTER — Encounter (HOSPITAL_COMMUNITY): Payer: Self-pay | Admitting: *Deleted

## 2014-07-08 ENCOUNTER — Emergency Department (HOSPITAL_COMMUNITY)
Admission: EM | Admit: 2014-07-08 | Discharge: 2014-07-08 | Disposition: A | Discharge status: Home / Self Care | Payer: Self-pay | Attending: Emergency Medicine | Admitting: Emergency Medicine

## 2014-07-08 DIAGNOSIS — Z720 Tobacco use: Secondary | ICD-10-CM | POA: Insufficient documentation

## 2014-07-08 DIAGNOSIS — Z87442 Personal history of urinary calculi: Secondary | ICD-10-CM | POA: Insufficient documentation

## 2014-07-08 DIAGNOSIS — M545 Low back pain, unspecified: Secondary | ICD-10-CM

## 2014-07-08 DIAGNOSIS — Z79899 Other long term (current) drug therapy: Secondary | ICD-10-CM | POA: Insufficient documentation

## 2014-07-08 DIAGNOSIS — Z7982 Long term (current) use of aspirin: Secondary | ICD-10-CM | POA: Insufficient documentation

## 2014-07-08 DIAGNOSIS — Z8709 Personal history of other diseases of the respiratory system: Secondary | ICD-10-CM | POA: Insufficient documentation

## 2014-07-08 DIAGNOSIS — F319 Bipolar disorder, unspecified: Secondary | ICD-10-CM | POA: Insufficient documentation

## 2014-07-08 DIAGNOSIS — Z87828 Personal history of other (healed) physical injury and trauma: Secondary | ICD-10-CM | POA: Insufficient documentation

## 2014-07-08 DIAGNOSIS — R110 Nausea: Secondary | ICD-10-CM | POA: Insufficient documentation

## 2014-07-08 DIAGNOSIS — R52 Pain, unspecified: Secondary | ICD-10-CM

## 2014-07-08 LAB — URINALYSIS, ROUTINE W REFLEX MICROSCOPIC
BILIRUBIN URINE: NEGATIVE
Glucose, UA: NEGATIVE mg/dL
HGB URINE DIPSTICK: NEGATIVE
KETONES UR: NEGATIVE mg/dL
Leukocytes, UA: NEGATIVE
NITRITE: NEGATIVE
Protein, ur: NEGATIVE mg/dL
SPECIFIC GRAVITY, URINE: 1.025 (ref 1.005–1.030)
Urobilinogen, UA: 0.2 mg/dL (ref 0.0–1.0)
pH: 6 (ref 5.0–8.0)

## 2014-07-08 LAB — CBC WITH DIFFERENTIAL/PLATELET
BASOS PCT: 0 % (ref 0–1)
Basophils Absolute: 0 10*3/uL (ref 0.0–0.1)
EOS ABS: 0.3 10*3/uL (ref 0.0–0.7)
EOS PCT: 5 % (ref 0–5)
HCT: 38.5 % — ABNORMAL LOW (ref 39.0–52.0)
Hemoglobin: 12.7 g/dL — ABNORMAL LOW (ref 13.0–17.0)
Lymphocytes Relative: 35 % (ref 12–46)
Lymphs Abs: 2.4 10*3/uL (ref 0.7–4.0)
MCH: 32.1 pg (ref 26.0–34.0)
MCHC: 33 g/dL (ref 30.0–36.0)
MCV: 97.2 fL (ref 78.0–100.0)
MONOS PCT: 7 % (ref 3–12)
Monocytes Absolute: 0.5 10*3/uL (ref 0.1–1.0)
NEUTROS PCT: 53 % (ref 43–77)
Neutro Abs: 3.7 10*3/uL (ref 1.7–7.7)
PLATELETS: 144 10*3/uL — AB (ref 150–400)
RBC: 3.96 MIL/uL — AB (ref 4.22–5.81)
RDW: 13.6 % (ref 11.5–15.5)
WBC: 7 10*3/uL (ref 4.0–10.5)

## 2014-07-08 LAB — COMPREHENSIVE METABOLIC PANEL
ALBUMIN: 3.6 g/dL (ref 3.5–5.2)
ALT: 8 U/L (ref 0–53)
ANION GAP: 6 (ref 5–15)
AST: 15 U/L (ref 0–37)
Alkaline Phosphatase: 36 U/L — ABNORMAL LOW (ref 39–117)
BUN: 19 mg/dL (ref 6–23)
CO2: 24 mmol/L (ref 19–32)
Calcium: 9.1 mg/dL (ref 8.4–10.5)
Chloride: 108 mEq/L (ref 96–112)
Creatinine, Ser: 0.91 mg/dL (ref 0.50–1.35)
GFR calc Af Amer: >90 mL/min (ref >90)
GFR calc non Af Amer: >90 mL/min (ref >90)
Glucose, Bld: 88 mg/dL (ref 70–99)
Potassium: 4 mmol/L (ref 3.5–5.1)
Sodium: 138 mmol/L (ref 135–145)
TOTAL PROTEIN: 6.6 g/dL (ref 6.0–8.3)
Total Bilirubin: 0.3 mg/dL (ref 0.3–1.2)

## 2014-07-08 MED ORDER — DIPHENHYDRAMINE HCL 50 MG/ML IJ SOLN
12.5000 mg | Freq: Once | INTRAMUSCULAR | Status: AC
  Administered 2014-07-08: 12.5 mg via INTRAVENOUS
  Filled 2014-07-08: qty 1

## 2014-07-08 MED ORDER — CYCLOBENZAPRINE HCL 10 MG PO TABS: 10.0000 mg | ORAL_TABLET | Freq: Three times a day (TID) | ORAL | Status: DC | PRN

## 2014-07-08 MED ORDER — OXYCODONE-ACETAMINOPHEN 5-325 MG PO TABS: 1.0000 | ORAL_TABLET | Freq: Four times a day (QID) | ORAL | Status: DC | PRN

## 2014-07-08 MED ORDER — HYDROMORPHONE HCL 1 MG/ML IJ SOLN
1.0000 mg | Freq: Once | INTRAMUSCULAR | Status: AC
  Administered 2014-07-08: 1 mg via INTRAVENOUS
  Filled 2014-07-08: qty 1

## 2014-07-08 MED ORDER — ONDANSETRON HCL 4 MG/2ML IJ SOLN
4.0000 mg | Freq: Once | INTRAMUSCULAR | Status: AC
  Administered 2014-07-08: 4 mg via INTRAVENOUS
  Filled 2014-07-08: qty 2

## 2014-07-08 NOTE — Discharge Instructions (Signed)
Follow up with your md if not improving. °

## 2014-07-08 NOTE — ED Notes (Signed)
Pt with lower back pain that started this morning, woke up with pain, pain intense at times feels nauseated; denies injury

## 2014-07-08 NOTE — ED Provider Notes (Signed)
CSN: [id number]     Arrival date & time 07/08/14  1829 History  This chart was scribed for Benny LennertJoseph L Alayasia Breeding, MD by Tonye RoyaltyJoshua Chen, ED Scribe. This patient was seen in room APA06/APA06 and the patient's care was started at 6:50 PM.    Chief Complaint  Patient presents with  . Back Pain   Patient is a 48 y.o. male presenting with back pain. The history is provided by the patient. No language interpreter was used.  Back Pain Location:  Lumbar spine Quality:  Unable to specify Radiates to: left and right sides. Pain severity:  Moderate Pain is:  Same all the time Onset quality:  Sudden Timing:  Constant Progression:  Waxing and waning Chronicity:  Recurrent Context: not recent injury   Relieved by:  None tried Worsened by:  Movement Ineffective treatments:  None tried Associated symptoms: no abdominal pain, no chest pain and no headaches   Risk factors comment:  Hx kidney stone   HPI Comments: Luke Craig is a 48 y.o. male who presents to the Emergency Department complaining of waning and waxing low back pain radiating to both sides with onset upon waking this morning. He states the only time he has had back pain before is with kidney stones. He states pain is worse with movement. He reports associated nausea. He denies recent injury or doing anything to make pain worse. He states that prior kidney stones did not pass and are still there.   Past Medical History  Diagnosis Date  . Bipolar 1 disorder   . Intermittent explosive disorder   . Schizophrenia   . Depression   . Bronchitis     hx  . History of kidney stones   . Open wound of right middle finger without damage to nail    Past Surgical History  Procedure Laterality Date  . Bunionectomy    . Shoulder surgery    . Vasectomy    . I&d extremity  10/30/2011    Procedure: IRRIGATION AND DEBRIDEMENT EXTREMITY;  Surgeon: Sharma CovertFred W Ortmann, MD;  Location: [facility] OR;  Service: Orthopedics;  Laterality: Right;  Wound exploration right  middle finger with repair   History reviewed. No pertinent family history. History  Substance Use Topics  . Smoking status: Current Every Day Smoker -- 1.00 packs/day    Types: Cigarettes  . Smokeless tobacco: Not on file  . Alcohol Use: No    Review of Systems  Constitutional: Negative for appetite change and fatigue.  HENT: Negative for congestion, ear discharge and sinus pressure.   Eyes: Negative for discharge.  Respiratory: Negative for cough.   Cardiovascular: Negative for chest pain.  Gastrointestinal: Positive for nausea. Negative for abdominal pain and diarrhea.  Genitourinary: Negative for frequency and hematuria.  Musculoskeletal: Positive for back pain.  Skin: Negative for rash.  Neurological: Negative for seizures and headaches.  Psychiatric/Behavioral: Negative for hallucinations.      Allergies  Aspirin; Ibuprofen; and Tramadol  Home Medications   Prior to Admission medications   Medication Sig Start Date End Date Taking? Authorizing Provider  acetaminophen (TYLENOL) 500 MG tablet Take 1,000 mg by mouth 3 (three) times daily as needed. For pain    Historical Provider, MD  ALPRAZolam Prudy Feeler(XANAX) 0.5 MG tablet Take 0.5 mg by mouth 4 (four) times daily as needed. For anxiety    Historical Provider, MD  busPIRone (BUSPAR) 10 MG tablet Take 30 mg by mouth 2 (two) times daily.    Historical Provider, MD  citalopram (  CELEXA) 20 MG tablet Take 20 mg by mouth daily.    Historical Provider, MD  divalproex (DEPAKOTE ER) 250 MG 24 hr tablet Take 250 mg by mouth daily. At 5pm takes with 500mg  for a total of 1750mg     Historical Provider, MD  divalproex (DEPAKOTE ER) 500 MG 24 hr tablet Take 1,500 mg by mouth daily. At 5pm    Historical Provider, MD  ondansetron (ZOFRAN) 4 MG tablet Take 1 tablet (4 mg total) by mouth every 6 (six) hours. 02/11/14   Glynn OctaveStephen Rancour, MD  oxyCODONE-acetaminophen (PERCOCET/ROXICET) 5-325 MG per tablet Take 1-2 tablets by mouth every 4 (four) hours  as needed. 01/26/14   Kristen N Ward, DO  oxyCODONE-acetaminophen (PERCOCET/ROXICET) 5-325 MG per tablet Take 2 tablets by mouth every 4 (four) hours as needed for severe pain. 02/11/14   Glynn OctaveStephen Rancour, MD  oxyCODONE-acetaminophen (PERCOCET/ROXICET) 5-325 MG per tablet Take 1 tablet by mouth every 4 (four) hours as needed. 05/12/14   Tammy L. Triplett, PA-C  promethazine (PHENERGAN) 25 MG tablet Take 1 tablet (25 mg total) by mouth every 6 (six) hours as needed for nausea or vomiting. 01/26/14   Kristen N Ward, DO   BP 120/82 mmHg  Pulse 68  Temp(Src) 98.9 F (37.2 C) (Oral)  Resp 16  SpO2 100% Physical Exam  Constitutional: He is oriented to person, place, and time. He appears well-developed.  HENT:  Head: Normocephalic.  Eyes: Conjunctivae and EOM are normal. No scleral icterus.  Neck: Neck supple. No thyromegaly present.  Cardiovascular: Normal rate and regular rhythm.  Exam reveals no gallop and no friction rub.   No murmur heard. Pulmonary/Chest: No stridor. He has no wheezes. He has no rales. He exhibits no tenderness.  Abdominal: He exhibits no distension. There is no tenderness. There is no rebound.  Musculoskeletal: Normal range of motion. He exhibits no edema.  Moderate right flank tenderness  Lymphadenopathy:    He has no cervical adenopathy.  Neurological: He is oriented to person, place, and time. He exhibits normal muscle tone. Coordination normal.  Skin: No rash noted. No erythema.  Psychiatric: He has a normal mood and affect. His behavior is normal.  Nursing note and vitals reviewed.   ED Course  Procedures (including critical care time)  DIAGNOSTIC STUDIES: Oxygen Saturation is 100% on room air, normal by my interpretation.    COORDINATION OF CARE: 6:52 PM Discussed treatment plan with patient at beside, the patient agrees with the plan and has no further questions at this time.   Labs Review Labs Reviewed  URINALYSIS, ROUTINE W REFLEX MICROSCOPIC     Imaging Review No results found.   EKG Interpretation None      MDM   Final diagnoses:  None    Back pain,   tx with flexeril,  Percocet and follow up  The chart was scribed for me under my direct supervision.  I personally performed the history, physical, and medical decision making and all procedures in the evaluation of this patient.Benny Lennert.   Jaquia Benedicto L Harrison Paulson, MD 07/08/14 [phone]

## 2015-03-02 ENCOUNTER — Emergency Department (HOSPITAL_COMMUNITY)
Admission: EM | Admit: 2015-03-02 | Discharge: 2015-03-03 | Disposition: A | Discharge status: Home / Self Care | Payer: Self-pay | Attending: Emergency Medicine | Admitting: Emergency Medicine

## 2015-03-02 ENCOUNTER — Encounter (HOSPITAL_COMMUNITY): Payer: Self-pay

## 2015-03-02 DIAGNOSIS — Z87442 Personal history of urinary calculi: Secondary | ICD-10-CM | POA: Insufficient documentation

## 2015-03-02 DIAGNOSIS — Z720 Tobacco use: Secondary | ICD-10-CM | POA: Insufficient documentation

## 2015-03-02 DIAGNOSIS — Z8709 Personal history of other diseases of the respiratory system: Secondary | ICD-10-CM | POA: Insufficient documentation

## 2015-03-02 DIAGNOSIS — H1131 Conjunctival hemorrhage, right eye: Secondary | ICD-10-CM | POA: Insufficient documentation

## 2015-03-02 DIAGNOSIS — Y9389 Activity, other specified: Secondary | ICD-10-CM | POA: Insufficient documentation

## 2015-03-02 DIAGNOSIS — W228XXA Striking against or struck by other objects, initial encounter: Secondary | ICD-10-CM | POA: Insufficient documentation

## 2015-03-02 DIAGNOSIS — F319 Bipolar disorder, unspecified: Secondary | ICD-10-CM | POA: Insufficient documentation

## 2015-03-02 DIAGNOSIS — S0501XA Injury of conjunctiva and corneal abrasion without foreign body, right eye, initial encounter: Secondary | ICD-10-CM | POA: Insufficient documentation

## 2015-03-02 DIAGNOSIS — Y9289 Other specified places as the place of occurrence of the external cause: Secondary | ICD-10-CM | POA: Insufficient documentation

## 2015-03-02 DIAGNOSIS — Z79899 Other long term (current) drug therapy: Secondary | ICD-10-CM | POA: Insufficient documentation

## 2015-03-02 DIAGNOSIS — Y998 Other external cause status: Secondary | ICD-10-CM | POA: Insufficient documentation

## 2015-03-02 NOTE — ED Notes (Signed)
Pt was weedeating tonight when he felt something hit his right eye, states it is more painful and having pressure since it happened.

## 2015-03-02 NOTE — ED Provider Notes (Signed)
CSN: [id number]     Arrival date & time 03/02/15  2331 History  This chart was scribed for Shon Baton, MD by Doreatha Martin, ED Scribe. This patient was seen in room APA18/APA18 and the patient's care was started at 11:56 PM.     Chief Complaint  Patient presents with  . Eye Injury   The history is provided by the patient. No language interpreter was used.    HPI Comments: Luke Craig is a 49 y.o. male with Hx of bipolar disorder, schizophrenia, depression, renal calculi who presents to the Emergency Department complaining of an injury to the right eye onset 6 hours PTA. He states he was weed-eating, not wearing safety glasses, when the guard came off and he was hit in the eye with a foreign obejct. Pt reports that he feels like the object is still in his eye. He states associated blurry vision, 8/10 throbbing eye pain.  He does not wear contacts. Last tetanus was in 2012.   Past Medical History  Diagnosis Date  . Bipolar 1 disorder   . Intermittent explosive disorder   . Schizophrenia   . Depression   . Bronchitis     hx  . History of kidney stones   . Open wound of right middle finger without damage to nail    Past Surgical History  Procedure Laterality Date  . Bunionectomy    . Shoulder surgery    . Vasectomy    . I&d extremity  10/30/2011    Procedure: IRRIGATION AND DEBRIDEMENT EXTREMITY;  Surgeon: Sharma Covert, MD;  Location: [facility] OR;  Service: Orthopedics;  Laterality: Right;  Wound exploration right middle finger with repair   No family history on file. Social History  Substance Use Topics  . Smoking status: Current Every Day Smoker -- 1.00 packs/day    Types: Cigarettes  . Smokeless tobacco: None  . Alcohol Use: No    Review of Systems  Eyes: Positive for pain and visual disturbance.  All other systems reviewed and are negative.   Allergies  Aspirin; Ibuprofen; and Tramadol  Home Medications   Prior to Admission medications   Medication Sig Start  Date End Date Taking? Authorizing Provider  acetaminophen (TYLENOL) 500 MG tablet Take 1,000 mg by mouth 3 (three) times daily as needed. For pain    Historical Provider, MD  ALPRAZolam Prudy Feeler) 0.5 MG tablet Take 0.5 mg by mouth 4 (four) times daily as needed. For anxiety    Historical Provider, MD  aspirin-acetaminophen-caffeine (EXCEDRIN MIGRAINE) [phone] MG per tablet Take 2 tablets by mouth daily as needed for headache.    Historical Provider, MD  busPIRone (BUSPAR) 10 MG tablet Take 30 mg by mouth 2 (two) times daily.    Historical Provider, MD  citalopram (CELEXA) 20 MG tablet Take 20 mg by mouth daily.    Historical Provider, MD  cyclobenzaprine (FLEXERIL) 10 MG tablet Take 1 tablet (10 mg total) by mouth 3 (three) times daily as needed for muscle spasms. 07/08/14   Bethann Berkshire, MD  divalproex (DEPAKOTE ER) 250 MG 24 hr tablet Take 250 mg by mouth daily. At 5pm takes with  for a total of     Historical Provider, MD  divalproex (DEPAKOTE ER) 500 MG 24 hr tablet Take 1,500 mg by mouth daily. At 5pm    Historical Provider, MD  erythromycin ophthalmic ointment Place a 1/2 inch ribbon of ointment into the lower eyelid four times daily 03/03/15   Shon Baton,  MD  HYDROcodone-acetaminophen (NORCO/VICODIN) 5-325 MG per tablet Take 1 tablet by mouth every 6 (six) hours as needed for moderate pain. 03/03/15   Shon Baton, MD  ondansetron (ZOFRAN) 4 MG tablet Take 1 tablet (4 mg total) by mouth every 6 (six) hours. Patient not taking: Reported on 07/08/2014 02/11/14   Glynn Octave, MD  oxyCODONE-acetaminophen (PERCOCET/ROXICET) 5-325 MG per tablet Take 1 tablet by mouth every 6 (six) hours as needed. 07/08/14   Bethann Berkshire, MD  promethazine (PHENERGAN) 25 MG tablet Take 1 tablet (25 mg total) by mouth every 6 (six) hours as needed for nausea or vomiting. Patient not taking: Reported on 07/08/2014 01/26/14   Kristen N Ward, DO   BP 117/82 mmHg  Pulse 66  Temp(Src) 98.2 F  (36.8 C) (Oral)  Resp 18  Ht 5\' 11"  (1.803 m)  Wt 196 lb (88.905 kg)  BMI 27.35 kg/m2  SpO2 99% Physical Exam  Constitutional: He is oriented to person, place, and time. He appears well-developed and well-nourished. No distress.  HENT:  Head: Normocephalic and atraumatic.  Eyes: EOM are normal. Pupils are equal, round, and reactive to light.  Pupils 3 mm reactive bilaterally, conjunctival hemorrhages noted over the medial aspect of the right sclera involving approximately 50% of the sclera on the medial aspect of the eye, fluoroscein exam with hazy uptake over the medial aspect of the cornea, no evidence of open globe, vision 20/30 on the right  Cardiovascular: Normal rate and regular rhythm.   Pulmonary/Chest: Effort normal. No respiratory distress.  Neurological: He is alert and oriented to person, place, and time.  Skin: Skin is warm and dry.  Psychiatric: He has a normal mood and affect.  Nursing note and vitals reviewed.   ED Course  Procedures (including critical care time) DIAGNOSTIC STUDIES: Oxygen Saturation is 99% on RA, normal by my interpretation.    COORDINATION OF CARE: 11:59 PM Discussed treatment plan with pt at bedside and pt agreed to plan.   Labs Review Labs Reviewed - No data to display  Imaging Review No results found. I have personally reviewed and evaluated these images and lab results as part of my medical decision-making.   EKG Interpretation None      MDM   Final diagnoses:  Corneal abrasion, right, initial encounter  Conjunctival hemorrhage, right    Patient presents with trauma and pain to the right eye. Exam notable for traumatic conjunctival hemorrhage without evidence of hyphema or open globe. Also has evidence of corneal abrasion. Patient was given pain medication and erythromycin ointment. Tetanus is up-to-date. Visual acuity is preserved. Patient given ophthalmology follow-up. Given strict return cautions.  After history, exam, and  medical workup I feel the patient has been appropriately medically screened and is safe for discharge home. Pertinent diagnoses were discussed with the patient. Patient was given return precautions.  I personally performed the services described in this documentation, which was scribed in my presence. The recorded information has been reviewed and is accurate.   Shon Baton, MD 03/03/15 [phone]

## 2015-03-03 MED ORDER — HYDROCODONE-ACETAMINOPHEN 5-325 MG PO TABS
1.0000 | ORAL_TABLET | Freq: Once | ORAL | Status: AC
  Administered 2015-03-03: 1 via ORAL
  Filled 2015-03-03: qty 1

## 2015-03-03 MED ORDER — FLUORESCEIN SODIUM 1 MG OP STRP
1.0000 | ORAL_STRIP | Freq: Once | OPHTHALMIC | Status: AC
  Administered 2015-03-03: 1 via OPHTHALMIC
  Filled 2015-03-03: qty 1

## 2015-03-03 MED ORDER — TETRACAINE HCL 0.5 % OP SOLN
1.0000 [drp] | Freq: Once | OPHTHALMIC | Status: AC
  Administered 2015-03-03: 1 [drp] via OPHTHALMIC
  Filled 2015-03-03: qty 2

## 2015-03-03 MED ORDER — HYDROCODONE-ACETAMINOPHEN 5-325 MG PO TABS: 1.0000 | ORAL_TABLET | Freq: Four times a day (QID) | ORAL | Status: DC | PRN

## 2015-03-03 MED ORDER — ERYTHROMYCIN 5 MG/GM OP OINT: TOPICAL_OINTMENT | OPHTHALMIC | Status: DC

## 2015-03-03 MED ORDER — ERYTHROMYCIN 5 MG/GM OP OINT
TOPICAL_OINTMENT | Freq: Once | OPHTHALMIC | Status: AC
  Administered 2015-03-03: 01:00:00 via OPHTHALMIC
  Filled 2015-03-03: qty 3.5

## 2015-03-03 NOTE — Discharge Instructions (Signed)
You were seen today and have a corneal abrasion of the right eye. He also have blood in the right eye probably related to being hit in the eye. Your workup is otherwise reassuring. You'll be given eye ointment and pain medication.  You were symptoms should improve over the next 2-3 days. He will be given ophthalmology follow-up if symptoms persist or worsen.  Corneal Abrasion The cornea is the clear covering at the front and center of the eye. When looking at the colored portion of the eye (iris), you are looking through the cornea. This very thin tissue is made up of many layers. The surface layer is a single layer of cells (corneal epithelium) and is one of the most sensitive tissues in the body. If a scratch or injury causes the corneal epithelium to come off, it is called a corneal abrasion. If the injury extends to the tissues below the epithelium, the condition is called a corneal ulcer. CAUSES   Scratches.  Trauma.  Foreign body in the eye. Some people have recurrences of abrasions in the area of the original injury even after it has healed (recurrent erosion syndrome). Recurrent erosion syndrome generally improves and goes away with time. SYMPTOMS   Eye pain.  Difficulty or inability to keep the injured eye open.  The eye becomes very sensitive to light.  Recurrent erosions tend to happen suddenly, first thing in the morning, usually after waking up and opening the eye. DIAGNOSIS  Your health care provider can diagnose a corneal abrasion during an eye exam. Dye is usually placed in the eye using a drop or a small paper strip moistened by your tears. When the eye is examined with a special light, the abrasion shows up clearly because of the dye. TREATMENT   Small abrasions may be treated with antibiotic drops or ointment alone.  A pressure patch may be put over the eye. If this is done, follow your doctor's instructions for when to remove the patch. Do not drive or use machines  while the eye patch is on. Judging distances is hard to do with a patch on. If the abrasion becomes infected and spreads to the deeper tissues of the cornea, a corneal ulcer can result. This is serious because it can cause corneal scarring. Corneal scars interfere with light passing through the cornea and cause a loss of vision in the involved eye. HOME CARE INSTRUCTIONS  Use medicine or ointment as directed. Only take over-the-counter or prescription medicines for pain, discomfort, or fever as directed by your health care provider.  Do not drive or operate machinery if your eye is patched. Your ability to judge distances is impaired.  If your health care provider has given you a follow-up appointment, it is very important to keep that appointment. Not keeping the appointment could result in a severe eye infection or permanent loss of vision. If there is any problem keeping the appointment, let your health care provider know. SEEK MEDICAL CARE IF:   You have pain, light sensitivity, and a scratchy feeling in one eye or both eyes.  Your pressure patch keeps loosening up, and you can blink your eye under the patch after treatment.  Any kind of discharge develops from the eye after treatment or if the lids stick together in the morning.  You have the same symptoms in the morning as you did with the original abrasion days, weeks, or months after the abrasion healed. MAKE SURE YOU:   Understand these instructions.  Will  watch your condition.  Will get help right away if you are not doing well or get worse. Document Released: 06/28/2000 Document Revised: 07/06/2013 Document Reviewed: 03/08/2013 [facility] Patient Information 2015 [place], [place]. This information is not intended to replace advice given to you by your health care provider. Make sure you discuss any questions you have with your health care provider.

## 2015-10-26 ENCOUNTER — Emergency Department (HOSPITAL_COMMUNITY): Payer: Self-pay

## 2015-10-26 ENCOUNTER — Encounter (HOSPITAL_COMMUNITY): Payer: Self-pay

## 2015-10-26 ENCOUNTER — Emergency Department (HOSPITAL_COMMUNITY)
Admission: EM | Admit: 2015-10-26 | Discharge: 2015-10-26 | Disposition: A | Discharge status: Home / Self Care | Payer: Self-pay | Attending: Emergency Medicine | Admitting: Emergency Medicine

## 2015-10-26 DIAGNOSIS — Z79899 Other long term (current) drug therapy: Secondary | ICD-10-CM | POA: Insufficient documentation

## 2015-10-26 DIAGNOSIS — N200 Calculus of kidney: Secondary | ICD-10-CM | POA: Insufficient documentation

## 2015-10-26 DIAGNOSIS — F209 Schizophrenia, unspecified: Secondary | ICD-10-CM | POA: Insufficient documentation

## 2015-10-26 DIAGNOSIS — F17210 Nicotine dependence, cigarettes, uncomplicated: Secondary | ICD-10-CM | POA: Insufficient documentation

## 2015-10-26 DIAGNOSIS — F319 Bipolar disorder, unspecified: Secondary | ICD-10-CM | POA: Insufficient documentation

## 2015-10-26 DIAGNOSIS — R52 Pain, unspecified: Secondary | ICD-10-CM

## 2015-10-26 LAB — URINALYSIS, ROUTINE W REFLEX MICROSCOPIC
Bilirubin Urine: NEGATIVE
GLUCOSE, UA: NEGATIVE mg/dL
Ketones, ur: NEGATIVE mg/dL
LEUKOCYTES UA: NEGATIVE
Nitrite: NEGATIVE
Protein, ur: NEGATIVE mg/dL
Specific Gravity, Urine: 1.025 (ref 1.005–1.030)
pH: 5.5 (ref 5.0–8.0)

## 2015-10-26 LAB — COMPREHENSIVE METABOLIC PANEL
ALK PHOS: 50 U/L (ref 38–126)
ALT: 10 U/L — AB (ref 17–63)
AST: 17 U/L (ref 15–41)
Albumin: 3.7 g/dL (ref 3.5–5.0)
Anion gap: 9 (ref 5–15)
BILIRUBIN TOTAL: 0.2 mg/dL — AB (ref 0.3–1.2)
BUN: 19 mg/dL (ref 6–20)
CHLORIDE: 106 mmol/L (ref 101–111)
CO2: 22 mmol/L (ref 22–32)
Calcium: 8.4 mg/dL — ABNORMAL LOW (ref 8.9–10.3)
Creatinine, Ser: 0.85 mg/dL (ref 0.61–1.24)
GFR calc Af Amer: >60 mL/min (ref >60)
GLUCOSE: 101 mg/dL — AB (ref 65–99)
Potassium: 3.5 mmol/L (ref 3.5–5.1)
Sodium: 137 mmol/L (ref 135–145)
Total Protein: 6.5 g/dL (ref 6.5–8.1)

## 2015-10-26 LAB — CBC WITH DIFFERENTIAL/PLATELET
Basophils Absolute: 0.1 10*3/uL (ref 0.0–0.1)
Basophils Relative: 1 %
Eosinophils Absolute: 0.2 10*3/uL (ref 0.0–0.7)
Eosinophils Relative: 2 %
HEMATOCRIT: 34.2 % — AB (ref 39.0–52.0)
Hemoglobin: 12 g/dL — ABNORMAL LOW (ref 13.0–17.0)
LYMPHS PCT: 18 %
Lymphs Abs: 1.6 10*3/uL (ref 0.7–4.0)
MCH: 31.7 pg (ref 26.0–34.0)
MCHC: 35.1 g/dL (ref 30.0–36.0)
MCV: 90.5 fL (ref 78.0–100.0)
MONO ABS: 0.8 10*3/uL (ref 0.1–1.0)
Monocytes Relative: 9 %
NEUTROS ABS: 6.4 10*3/uL (ref 1.7–7.7)
Neutrophils Relative %: 70 %
Platelets: 242 10*3/uL (ref 150–400)
RBC: 3.78 MIL/uL — AB (ref 4.22–5.81)
RDW: 12.8 % (ref 11.5–15.5)
WBC: 9.1 10*3/uL (ref 4.0–10.5)

## 2015-10-26 LAB — URINE MICROSCOPIC-ADD ON

## 2015-10-26 MED ORDER — TAMSULOSIN HCL 0.4 MG PO CAPS: 0.4000 mg | ORAL_CAPSULE | Freq: Every day | ORAL | Status: DC

## 2015-10-26 MED ORDER — OXYCODONE-ACETAMINOPHEN 5-325 MG PO TABS: 1.0000 | ORAL_TABLET | Freq: Four times a day (QID) | ORAL | Status: DC | PRN

## 2015-10-26 MED ORDER — ONDANSETRON HCL 4 MG/2ML IJ SOLN
4.0000 mg | Freq: Once | INTRAMUSCULAR | Status: AC
  Administered 2015-10-26: 4 mg via INTRAVENOUS
  Filled 2015-10-26: qty 2

## 2015-10-26 MED ORDER — ONDANSETRON 4 MG PO TBDP: ORAL_TABLET | ORAL | Status: DC

## 2015-10-26 MED ORDER — HYDROMORPHONE HCL 1 MG/ML IJ SOLN
1.0000 mg | Freq: Once | INTRAMUSCULAR | Status: AC
  Administered 2015-10-26: 1 mg via INTRAVENOUS
  Filled 2015-10-26: qty 1

## 2015-10-26 NOTE — Discharge Instructions (Signed)
Follow-up with [facility] next week. If you have fever uncontrolled vomiting or uncontrolled pain over the weekend [facility]

## 2015-10-26 NOTE — ED Notes (Signed)
Pt reports left flank pain radiating to groin and back. Pt says it feels like a kidney stone as he has a history of this.

## 2015-10-26 NOTE — ED Notes (Signed)
Pt reports taking Tylenol approx 3  1/2 hours ago, 2 tablets 500 mg each with no relief.

## 2015-10-26 NOTE — ED Provider Notes (Signed)
CSN: [id number]     Arrival date & time 10/26/15  2117 History  By signing my name below, I, Tanda RockersMargaux Venter, attest that this documentation has been prepared under the direction and in the presence of Bethann BerkshireJoseph Maxon Kresse, MD. Electronically Signed: Tanda RockersMargaux Venter, ED Scribe. 10/26/2015. 9:57 PM.   Chief Complaint  Patient presents with  . Flank Pain   Patient is a 50 y.o. male presenting with flank pain. The history is provided by the patient. No language interpreter was used.  Flank Pain This is a new problem. The current episode started yesterday. The problem occurs rarely. The problem has not changed since onset.Associated symptoms include abdominal pain. Pertinent negatives include no chest pain and no headaches. Nothing aggravates the symptoms. Nothing relieves the symptoms. He has tried acetaminophen for the symptoms. The treatment provided no relief.     HPI Comments: Luke Craig is a 50 y.o. male who presents to the Emergency Department complaining of gradual onset, constant, moderate, LLQ pain radiating to left flank that began last night. Pt has hx of kidney stones and states that this pain feels similar. Denies nausea, vomiting, fever, chills, or any other associated symptoms.   Past Medical History  Diagnosis Date  . Bipolar 1 disorder (HCC)   . Intermittent explosive disorder   . Schizophrenia (HCC)   . Depression   . Bronchitis     hx  . History of kidney stones   . Open wound of right middle finger without damage to nail    Past Surgical History  Procedure Laterality Date  . Bunionectomy    . Shoulder surgery    . Vasectomy    . I&d extremity  10/30/2011    Procedure: IRRIGATION AND DEBRIDEMENT EXTREMITY;  Surgeon: Sharma CovertFred W Ortmann, MD;  [facility] OR;  Service: Orthopedics;  Laterality: Right;  Wound exploration right middle finger with repair   No family history on file. Social History  Substance Use Topics  . Smoking status: Current Every Day Smoker -- 1.00  packs/day    Types: Cigarettes  . Smokeless tobacco: None  . Alcohol Use: No    Review of Systems  Constitutional: Negative for fever, chills, appetite change and fatigue.  HENT: Negative for congestion, ear discharge and sinus pressure.   Eyes: Negative for discharge.  Respiratory: Negative for cough.   Cardiovascular: Negative for chest pain.  Gastrointestinal: Positive for abdominal pain. Negative for nausea, vomiting and diarrhea.  Genitourinary: Positive for flank pain (left). Negative for frequency and hematuria.  Musculoskeletal: Negative for back pain.  Skin: Negative for rash.  Neurological: Negative for seizures and headaches.  Psychiatric/Behavioral: Negative for hallucinations.      Allergies  Aspirin; Ibuprofen; and Tramadol  Home Medications   Prior to Admission medications   Medication Sig Start Date End Date Taking? Authorizing Provider  acetaminophen (TYLENOL) 500 MG tablet Take 1,000 mg by mouth 3 (three) times daily as needed. For pain    Historical Provider, MD  ALPRAZolam Prudy Feeler(XANAX) 0.5 MG tablet Take 0.5 mg by mouth 4 (four) times daily as needed. For anxiety    Historical Provider, MD  aspirin-acetaminophen-caffeine (EXCEDRIN MIGRAINE) [phone] MG per tablet Take 2 tablets by mouth daily as needed for headache.    Historical Provider, MD  busPIRone (BUSPAR) 10 MG tablet Take 30 mg by mouth 2 (two) times daily.    Historical Provider, MD  citalopram (CELEXA) 20 MG tablet Take 20 mg by mouth daily.    Historical Provider, MD  cyclobenzaprine (  FLEXERIL) 10 MG tablet Take 1 tablet (10 mg total) by mouth 3 (three) times daily as needed for muscle spasms. 07/08/14   Bethann Berkshire, MD  divalproex (DEPAKOTE ER) 250 MG 24 hr tablet Take 250 mg by mouth daily. At 5pm takes with  for a total of     Historical Provider, MD  divalproex (DEPAKOTE ER) 500 MG 24 hr tablet Take 1,500 mg by mouth daily. At 5pm    Historical Provider, MD  erythromycin ophthalmic  ointment Place a 1/2 inch ribbon of ointment into the lower eyelid four times daily 03/03/15   Shon Baton, MD  HYDROcodone-acetaminophen (NORCO/VICODIN) 5-325 MG per tablet Take 1 tablet by mouth every 6 (six) hours as needed for moderate pain. 03/03/15   Shon Baton, MD  ondansetron (ZOFRAN) 4 MG tablet Take 1 tablet (4 mg total) by mouth every 6 (six) hours. Patient not taking: Reported on 07/08/2014 02/11/14   Glynn Octave, MD  oxyCODONE-acetaminophen (PERCOCET/ROXICET) 5-325 MG per tablet Take 1 tablet by mouth every 6 (six) hours as needed. 07/08/14   Bethann Berkshire, MD  promethazine (PHENERGAN) 25 MG tablet Take 1 tablet (25 mg total) by mouth every 6 (six) hours as needed for nausea or vomiting. Patient not taking: Reported on 07/08/2014 01/26/14   Kristen N Ward, DO   BP 141/91 mmHg  Pulse 68  Temp(Src) 98.6 F (37 C) (Oral)  Resp 18  Ht  (1.803 m)  Wt 169 lb (76.658 kg)  BMI 23.58 kg/m2  SpO2 100%   Physical Exam  Constitutional: He is oriented to person, place, and time. He appears well-developed.  HENT:  Head: Normocephalic.  Eyes: Conjunctivae and EOM are normal. No scleral icterus.  Neck: Neck supple. No thyromegaly present.  Cardiovascular: Normal rate and regular rhythm.  Exam reveals no gallop and no friction rub.   No murmur heard. Pulmonary/Chest: No stridor. He has no wheezes. He has no rales. He exhibits no tenderness.  Abdominal: He exhibits no distension. There is no tenderness. There is CVA tenderness. There is no rebound.  Moderate left flank tenderness  Musculoskeletal: Normal range of motion. He exhibits no edema.  Lymphadenopathy:    He has no cervical adenopathy.  Neurological: He is oriented to person, place, and time. He exhibits normal muscle tone. Coordination normal.  Skin: No rash noted. No erythema.  Psychiatric: He has a normal mood and affect. His behavior is normal.    ED Course  Procedures (including critical care  time)  DIAGNOSTIC STUDIES: Oxygen Saturation is 100% on RA, normal by my interpretation.    COORDINATION OF CARE: 9:55 PM-Discussed treatment plan which includes UA with pt at bedside and pt agreed to plan.   Labs Review Labs Reviewed  URINALYSIS, ROUTINE W REFLEX MICROSCOPIC (NOT AT [facility])    Imaging Review No results found. I have personally reviewed and evaluated these images and lab results as part of my medical decision-making.   EKG Interpretation None      MDM   Final diagnoses:  None   Patient with large kidney stone in his left ureter. No evidence of infection. Patient's pain is controlled with 1 dose of Dilaudid. He'll be sent home with Zofran Flomax Percocet and will follow up with [facility].   The chart was scribed for me under my direct supervision.  I personally performed the history, physical, and medical decision making and all procedures in the evaluation of this patient.Bethann Berkshire,  MD 10/26/15 2323

## 2015-10-30 ENCOUNTER — Encounter (HOSPITAL_COMMUNITY): Payer: Self-pay | Admitting: Emergency Medicine

## 2015-10-30 ENCOUNTER — Emergency Department (HOSPITAL_COMMUNITY)
Admission: EM | Admit: 2015-10-30 | Discharge: 2015-10-31 | Disposition: A | Discharge status: Home / Self Care | Payer: Self-pay | Attending: Emergency Medicine | Admitting: Emergency Medicine

## 2015-10-30 DIAGNOSIS — Z87828 Personal history of other (healed) physical injury and trauma: Secondary | ICD-10-CM | POA: Insufficient documentation

## 2015-10-30 DIAGNOSIS — N200 Calculus of kidney: Secondary | ICD-10-CM | POA: Insufficient documentation

## 2015-10-30 DIAGNOSIS — Z8709 Personal history of other diseases of the respiratory system: Secondary | ICD-10-CM | POA: Insufficient documentation

## 2015-10-30 DIAGNOSIS — Z79899 Other long term (current) drug therapy: Secondary | ICD-10-CM | POA: Insufficient documentation

## 2015-10-30 DIAGNOSIS — F17210 Nicotine dependence, cigarettes, uncomplicated: Secondary | ICD-10-CM | POA: Insufficient documentation

## 2015-10-30 DIAGNOSIS — R109 Unspecified abdominal pain: Secondary | ICD-10-CM

## 2015-10-30 DIAGNOSIS — F319 Bipolar disorder, unspecified: Secondary | ICD-10-CM | POA: Insufficient documentation

## 2015-10-30 NOTE — ED Notes (Signed)
Pt states that he was dx with kidney stone on L side several days ago and has had increased pain today. Taking oxycodone w/o relief. Alert and oriented.

## 2015-10-31 LAB — I-STAT CHEM 8, ED
BUN: 16 mg/dL (ref 6–20)
CREATININE: 0.7 mg/dL (ref 0.61–1.24)
Calcium, Ion: 1.17 mmol/L (ref 1.12–1.23)
Chloride: 104 mmol/L (ref 101–111)
GLUCOSE: 83 mg/dL (ref 65–99)
HEMATOCRIT: 42 % (ref 39.0–52.0)
Hemoglobin: 14.3 g/dL (ref 13.0–17.0)
Potassium: 3.7 mmol/L (ref 3.5–5.1)
Sodium: 139 mmol/L (ref 135–145)
TCO2: 22 mmol/L (ref 0–100)

## 2015-10-31 MED ORDER — HYDROMORPHONE HCL 1 MG/ML IJ SOLN
1.0000 mg | Freq: Once | INTRAMUSCULAR | Status: AC
Start: 2015-10-31 — End: 2015-10-31
  Administered 2015-10-31: 1 mg via INTRAVENOUS
  Filled 2015-10-31: qty 1

## 2015-10-31 MED ORDER — KETOROLAC TROMETHAMINE 30 MG/ML IJ SOLN
30.0000 mg | Freq: Once | INTRAMUSCULAR | Status: AC
  Administered 2015-10-31: 30 mg via INTRAVENOUS
  Filled 2015-10-31: qty 1

## 2015-10-31 MED ORDER — OXYCODONE-ACETAMINOPHEN 5-325 MG PO TABS
2.0000 | ORAL_TABLET | Freq: Once | ORAL | Status: AC
Start: 2015-10-31 — End: 2015-10-31
  Administered 2015-10-31: 2 via ORAL
  Filled 2015-10-31: qty 2

## 2015-10-31 MED FILL — Oxycodone w/ Acetaminophen Tab 5-325 MG: ORAL | Qty: 6 | Status: AC

## 2015-10-31 NOTE — Discharge Instructions (Signed)
Continue taking Flomax as prescribed and Percocet as needed for pain control. Follow-up with [facility]. Return as needed that symptoms worsen.  Kidney Stones Kidney stones (urolithiasis) are deposits that form inside your kidneys. The intense pain is caused by the stone moving through the urinary tract. When the stone moves, the ureter goes into spasm around the stone. The stone is usually passed in the urine.  CAUSES   A disorder that makes certain neck glands produce too much parathyroid hormone (primary hyperparathyroidism).  A buildup of uric acid crystals, similar to gout in your joints.  Narrowing (stricture) of the ureter.  A kidney obstruction present at birth (congenital obstruction).  Previous surgery on the kidney or ureters.  Numerous kidney infections. SYMPTOMS   Feeling sick to your stomach (nauseous).  Throwing up (vomiting).  Blood in the urine (hematuria).  Pain that usually spreads (radiates) to the groin.  Frequency or urgency of urination. DIAGNOSIS   Taking a history and physical exam.  Blood or urine tests.  CT scan.  Occasionally, an examination of the inside of the urinary bladder (cystoscopy) is performed. TREATMENT   Observation.  Increasing your fluid intake.  Extracorporeal shock wave lithotripsy--This is a noninvasive procedure that uses shock waves to break up kidney stones.  Surgery may be needed if you have severe pain or persistent obstruction. There are various surgical procedures. Most of the procedures are performed with the use of small instruments. Only small incisions are needed to accommodate these instruments, so recovery time is minimized. The size, location, and chemical composition are all important variables that will determine the proper choice of action for you. Talk to your health care provider to better understand your situation so that you will minimize the risk of injury to yourself and your kidney.  HOME CARE  INSTRUCTIONS   Drink enough water and fluids to keep your urine clear or pale yellow. This will help you to pass the stone or stone fragments.  Strain all urine through the provided strainer. Keep all particulate matter and stones for your health care provider to see. The stone causing the pain may be as small as a grain of salt. It is very important to use the strainer each and every time you pass your urine. The collection of your stone will allow your health care provider to analyze it and verify that a stone has actually passed. The stone analysis will often identify what you can do to reduce the incidence of recurrences.  Only take over-the-counter or prescription medicines for pain, discomfort, or fever as directed by your health care provider.  Keep all follow-up visits as told by your health care provider. This is important.  Get follow-up X-rays if required. The absence of pain does not always mean that the stone has passed. It may have only stopped moving. If the urine remains completely obstructed, it can cause loss of kidney function or even complete destruction of the kidney. It is your responsibility to make sure X-rays and follow-ups are completed. Ultrasounds of the kidney can show blockages and the status of the kidney. Ultrasounds are not associated with any radiation and can be performed easily in a matter of minutes.  Make changes to your daily diet as told by your health care provider. You may be told to:  Limit the amount of salt that you eat.  Eat 5 or more servings of fruits and vegetables each day.  Limit the amount of meat, poultry, fish, and eggs that you  eat.  Collect a 24-hour urine sample as told by your health care provider.You may need to collect another urine sample every 6-12 months. SEEK MEDICAL CARE IF:  You experience pain that is progressive and unresponsive to any pain medicine you have been prescribed. SEEK IMMEDIATE MEDICAL CARE IF:   Pain cannot be  controlled with the prescribed medicine.  You have a fever or shaking chills.  The severity or intensity of pain increases over 18 hours and is not relieved by pain medicine.  You develop a new onset of abdominal pain.  You feel faint or pass out.  You are unable to urinate.   This information is not intended to replace advice given to you by your health care provider. Make sure you discuss any questions you have with your health care provider.   Document Released: 07/01/2005 Document Revised: 03/22/2015 Document Reviewed: 12/02/2012 Elsevier Interactive Patient Education [employer]

## 2015-10-31 NOTE — ED Notes (Signed)
Patient d/c'd self care.  Wife driving vehicle to take patient home.  F/U reviewed.  Patient verbalized understanding.

## 2015-10-31 NOTE — ED Provider Notes (Signed)
CSN: [id number]     Arrival date & time 10/30/15  2143 History   First MD Initiated Contact with Patient 10/31/15 0118     Chief Complaint  Patient presents with  . Nephrolithiasis     (Consider location/radiation/quality/duration/timing/severity/associated sxs/prior Treatment) HPI Comments: Patient is a 50 year old male with a history of bipolar 1 disorder, schizophrenia, depression, and kidney stones. He presents to the emergency department for persistent left-sided flank pain. Patient was diagnosed with a kidney stone on 10/26/2015 which was 7 mm x 5 mm on CT scan. He reports that his symptoms worsened today and persisted despite oral Percocet. He describes the pain as sharp and stabbing, waxing and waning in severity. Patient rates the pain at 6/10 currently. The pain will intermittently radiate from his left mid abdomen to his left groin. He denies a history of abdominal surgeries or interventions for past kidney stones. He has noticed some hematuria today. Patient denies associated fever, nausea, vomiting, bowel changes. He states that he tried to follow up with [facility], but was unable to get into the office for follow-up.  The history is provided by the patient. No language interpreter was used.    Past Medical History  Diagnosis Date  . Bipolar 1 disorder (HCC)   . Intermittent explosive disorder   . Schizophrenia (HCC)   . Depression   . Bronchitis     hx  . History of kidney stones   . Open wound of right middle finger without damage to nail    Past Surgical History  Procedure Laterality Date  . Bunionectomy    . Shoulder surgery    . Vasectomy    . I&d extremity  10/30/2011    Procedure: IRRIGATION AND DEBRIDEMENT EXTREMITY;  Surgeon: Sharma Covert, MD;  Location: [facility] OR;  Service: Orthopedics;  Laterality: Right;  Wound exploration right middle finger with repair   History reviewed. No pertinent family history. Social History  Substance Use Topics  . Smoking  status: Current Every Day Smoker -- 1.00 packs/day    Types: Cigarettes  . Smokeless tobacco: None  . Alcohol Use: No    Review of Systems  Constitutional: Negative for fever.  Gastrointestinal: Negative for nausea and vomiting.  Genitourinary: Positive for hematuria and flank pain.  All other systems reviewed and are negative.   Allergies  Aspirin; Ibuprofen; and Tramadol  Home Medications   Prior to Admission medications   Medication Sig Start Date End Date Taking? Authorizing Provider  acetaminophen (TYLENOL) 500 MG tablet Take 1,000 mg by mouth 3 (three) times daily as needed. For pain   Yes Historical Provider, MD  ALPRAZolam Prudy Feeler) 0.5 MG tablet Take 0.5 mg by mouth 4 (four) times daily as needed for anxiety.    Yes Historical Provider, MD  busPIRone (BUSPAR) 10 MG tablet Take 30 mg by mouth 2 (two) times daily.   Yes Historical Provider, MD  citalopram (CELEXA) 20 MG tablet Take 20 mg by mouth daily.   Yes Historical Provider, MD  divalproex (DEPAKOTE ER) 500 MG 24 hr tablet Take 1,500 mg by mouth daily. At 5pm   Yes Historical Provider, MD  oxyCODONE-acetaminophen (PERCOCET/ROXICET) 5-325 MG tablet Take 1 tablet by mouth every 6 (six) hours as needed. Patient taking differently: Take 1 tablet by mouth every 6 (six) hours as needed for moderate pain or severe pain.  10/26/15  Yes Bethann Berkshire, MD  tamsulosin (FLOMAX) 0.4 MG CAPS capsule Take 1 capsule (0.4 mg total) by mouth daily. 10/26/15  Yes Bethann Berkshire, MD  ondansetron (ZOFRAN ODT) 4 MG disintegrating tablet  ODT q4 hours prn nausea/vomit Patient not taking: Reported on 10/31/2015 10/26/15   Bethann Berkshire, MD   BP 120/86 mmHg  Pulse 61  Temp(Src) 98.3 F (36.8 C) (Oral)  Resp 16  SpO2 100%   Physical Exam  Constitutional: He is oriented to person, place, and time. He appears well-developed and well-nourished. No distress.  Nontoxic/nonseptic appearing. Patient pleasant.  HENT:  Head: Normocephalic and  atraumatic.  Eyes: Conjunctivae and EOM are normal. No scleral icterus.  Neck: Normal range of motion.  Cardiovascular: Normal rate, regular rhythm and intact distal pulses.   Pulmonary/Chest: Effort normal and breath sounds normal. No respiratory distress. He has no wheezes. He has no rales.  Respirations even and unlabored.  Abdominal: Soft. He exhibits no distension. There is tenderness. There is no rebound and no guarding.  Mild left mid abdominal tenderness. No masses or peritoneal signs. No rigidity. Abdomen soft.  Musculoskeletal: Normal range of motion.  Neurological: He is alert and oriented to person, place, and time. He exhibits normal muscle tone. Coordination normal.  GCS 15. Patient moving all extremities.  Skin: Skin is warm and dry. No rash noted. He is not diaphoretic. No erythema. No pallor.  Psychiatric: He has a normal mood and affect. His behavior is normal.  Nursing note and vitals reviewed.   ED Course  Procedures (including critical care time) Labs Review Labs Reviewed  I-STAT CHEM 8, ED    Imaging Review Ct Renal Stone Study  10/26/2015  CLINICAL DATA:  One day history of left flank pain radiating into inguinal region. EXAM: CT ABDOMEN AND PELVIS WITHOUT CONTRAST TECHNIQUE: Multidetector CT imaging of the abdomen and pelvis was performed following the standard protocol without oral or intravenous contrast material administration. COMPARISON:  July 08, 2014 FINDINGS: Lower chest: There is bibasilar scarring. No lung base edema or consolidation. Hepatobiliary: No focal liver lesions are identified on this noncontrast enhanced study. Gallbladder is mildly contracted without appreciable wall thickening. There is no biliary duct dilatation. Pancreas:  No pancreatic mass or inflammatory focus. Spleen:  No splenic lesions are evident. Adrenals/ Urinary Tract: Adrenals appear normal bilaterally. There is no renal mass on either side. There is severe hydronephrosis on the  left. Left kidney is edematous with perinephric stranding on the left. There is no appreciable hydronephrosis on the right. There is a 6 x 3 mm calculus in the mid right kidney. There is a 7 x 7 mm calculus in the posterior right kidney. There is no ureteral calculus on the right. On the left, there is a 1 mm calculus in the anterior mid kidney region. A second nearby 1 mm calculus is seen in the mid kidney region. There is a 3 x 3 mm calculus in the lower pole left kidney. There is a calculus at the level of L5 in the left ureter measuring 7 x 5 mm. No other ureteral calculi identified. Urinary bladder is midline with wall thickness within normal limits. Stomach/ Bowel: The small bowel loops are fluid filled. There is no bowel wall or mesenteric thickening. No bowel obstruction. No free air or portal venous air. Vascular/Lymphatic: There is mild calcification in the distal aorta. There is no abdominal aortic aneurysm. No vascular lesions are evident on this noncontrast enhanced study. There is no appreciable adenopathy in the abdomen or pelvis. Reproductive: Prostate and seminal vesicles appear unremarkable. No pelvic mass or pelvic fluid collection. Other: Appendix appears normal.  There is no abscess or ascites in the abdomen or pelvis. Musculoskeletal: There is a focal lucent area in the inferior right iliac bone with a sclerotic periphery measuring 2.6 x 1.1 cm, stable from prior study. Small sclerotic areas nearby are stable. No new bone lesions are evident. There is degenerative change in the lumbar spine. There is no intramuscular or abdominal wall lesion. IMPRESSION: Severe hydronephrosis on the left with left renal edema and perinephric stranding. There is a 7 x 5 mm calculus in the left ureter at the level of L4 causing the severe hydronephrosis on the left. There are intrarenal calculi bilaterally. No hydronephrosis on the right. No ureteral calculus on the right. Small bowel loops are virtually entirely  fluid-filled. This finding may indicate enteritis or ileus, likely secondary to the obstructing lesion left kidney. No bowel obstruction. No abscess. Appendix appears normal. Stable benign-appearing lesion inferior right iliac crest. Electronically Signed   By: Bretta BangWilliam  Woodruff III M.D.   On: 10/26/2015 23:05    I have personally reviewed and evaluated these images and lab results as part of my medical decision-making.   EKG Interpretation None      3:25 AM Patient reassessed. He reports improvement in his symptoms. He currently rates his pain at 0-1/10. He states that he is comfortable with discharge and further outpatient management. I have discussed the need for urology follow-up. Patient verbalizes understanding. MDM   Final diagnoses:  Left flank pain  Kidney stone    Patient recently diagnosed with kidney stone via CT, presents for persistent L flank pain. Symptoms well controlled with Dilaudid and Toradol. Serum creatine WNL, vitals sign stable, and the pt does not have irratractable vomiting. Pt will be discharged home with pain medications and has been advised to follow up with a urologist. Patient discharged in satisfactory condition with no unaddressed concerns.   Filed Vitals:   10/30/15 2150 10/31/15 0114 10/31/15 0114 10/31/15 0341  BP: 134/86  121/87 120/86  Pulse: 86  71 61  Temp: 98.3 F (36.8 C)     TempSrc: Oral     Resp: 18  18 16   SpO2: 99% 98% 100% 100%     Antony MaduraKelly Allyah Heather, PA-C 10/31/15 1955  Dione Boozeavid Glick, MD 10/31/15 2226

## 2015-11-02 ENCOUNTER — Emergency Department (HOSPITAL_COMMUNITY): Payer: Self-pay

## 2015-11-02 ENCOUNTER — Encounter (HOSPITAL_COMMUNITY): Payer: Self-pay

## 2015-11-02 ENCOUNTER — Emergency Department (HOSPITAL_COMMUNITY)
Admission: EM | Admit: 2015-11-02 | Discharge: 2015-11-03 | Disposition: A | Discharge status: Home / Self Care | Payer: Self-pay | Attending: Emergency Medicine | Admitting: Emergency Medicine

## 2015-11-02 DIAGNOSIS — F209 Schizophrenia, unspecified: Secondary | ICD-10-CM | POA: Insufficient documentation

## 2015-11-02 DIAGNOSIS — N200 Calculus of kidney: Secondary | ICD-10-CM

## 2015-11-02 DIAGNOSIS — N23 Unspecified renal colic: Secondary | ICD-10-CM

## 2015-11-02 DIAGNOSIS — F17210 Nicotine dependence, cigarettes, uncomplicated: Secondary | ICD-10-CM | POA: Insufficient documentation

## 2015-11-02 DIAGNOSIS — F329 Major depressive disorder, single episode, unspecified: Secondary | ICD-10-CM | POA: Insufficient documentation

## 2015-11-02 DIAGNOSIS — J449 Chronic obstructive pulmonary disease, unspecified: Secondary | ICD-10-CM | POA: Insufficient documentation

## 2015-11-02 DIAGNOSIS — N202 Calculus of kidney with calculus of ureter: Secondary | ICD-10-CM | POA: Insufficient documentation

## 2015-11-02 DIAGNOSIS — Z79899 Other long term (current) drug therapy: Secondary | ICD-10-CM | POA: Insufficient documentation

## 2015-11-02 LAB — URINALYSIS, ROUTINE W REFLEX MICROSCOPIC
BILIRUBIN URINE: NEGATIVE
Glucose, UA: NEGATIVE mg/dL
KETONES UR: NEGATIVE mg/dL
LEUKOCYTES UA: NEGATIVE
NITRITE: NEGATIVE
PH: 5.5 (ref 5.0–8.0)
SPECIFIC GRAVITY, URINE: 1.025 (ref 1.005–1.030)

## 2015-11-02 LAB — BASIC METABOLIC PANEL
Anion gap: 10 (ref 5–15)
BUN: 22 mg/dL — ABNORMAL HIGH (ref 6–20)
CALCIUM: 9.3 mg/dL (ref 8.9–10.3)
CO2: 23 mmol/L (ref 22–32)
CREATININE: 0.71 mg/dL (ref 0.61–1.24)
Chloride: 106 mmol/L (ref 101–111)
GFR calc non Af Amer: >60 mL/min (ref >60)
Glucose, Bld: 99 mg/dL (ref 65–99)
Potassium: 4 mmol/L (ref 3.5–5.1)
SODIUM: 139 mmol/L (ref 135–145)

## 2015-11-02 LAB — CBC WITH DIFFERENTIAL/PLATELET
BASOS ABS: 0 10*3/uL (ref 0.0–0.1)
BASOS PCT: 1 %
EOS ABS: 0.3 10*3/uL (ref 0.0–0.7)
Eosinophils Relative: 4 %
HEMATOCRIT: 35.7 % — AB (ref 39.0–52.0)
HEMOGLOBIN: 12.3 g/dL — AB (ref 13.0–17.0)
Lymphocytes Relative: 30 %
Lymphs Abs: 2.2 10*3/uL (ref 0.7–4.0)
MCH: 31 pg (ref 26.0–34.0)
MCHC: 34.5 g/dL (ref 30.0–36.0)
MCV: 89.9 fL (ref 78.0–100.0)
Monocytes Absolute: 0.5 10*3/uL (ref 0.1–1.0)
Monocytes Relative: 7 %
NEUTROS ABS: 4.3 10*3/uL (ref 1.7–7.7)
NEUTROS PCT: 58 %
Platelets: 241 10*3/uL (ref 150–400)
RBC: 3.97 MIL/uL — AB (ref 4.22–5.81)
RDW: 12.6 % (ref 11.5–15.5)
WBC: 7.2 10*3/uL (ref 4.0–10.5)

## 2015-11-02 LAB — URINE MICROSCOPIC-ADD ON: SQUAMOUS EPITHELIAL / LPF: NONE SEEN

## 2015-11-02 MED ORDER — HYDROMORPHONE HCL 1 MG/ML IJ SOLN
1.0000 mg | Freq: Once | INTRAMUSCULAR | Status: AC
  Administered 2015-11-02: 1 mg via INTRAMUSCULAR
  Filled 2015-11-02: qty 1

## 2015-11-02 MED ORDER — KETOROLAC TROMETHAMINE 60 MG/2ML IM SOLN
60.0000 mg | Freq: Once | INTRAMUSCULAR | Status: AC
  Administered 2015-11-02: 60 mg via INTRAMUSCULAR
  Filled 2015-11-02: qty 2

## 2015-11-02 MED ORDER — PROMETHAZINE HCL 12.5 MG PO TABS
25.0000 mg | ORAL_TABLET | Freq: Once | ORAL | Status: AC
  Administered 2015-11-02: 25 mg via ORAL
  Filled 2015-11-02: qty 2

## 2015-11-02 NOTE — ED Provider Notes (Signed)
CSN: [id number]     Arrival date & time 11/02/15  2226 History   First MD Initiated Contact with Patient 11/02/15 2231     Chief Complaint  Patient presents with  . Nephrolithiasis     (Consider location/radiation/quality/duration/timing/severity/associated sxs/prior Treatment) HPI Comments: Patient is a 50 year old male who presents to the emergency department with left flank pain.  The patient has a history of kidney stones. His last CT scan was on April 13, at which time he was noted to have a 7 mm kidney stone present. His pain was able to be controlled, and he was sent home with Zofran now Flomax and Percocet. The patient states that he called the [facility] and was on told that at this time his appointment could not be scheduled for nearly a month. The patient use the medication, but had another bout of pain on April 18. His pain was controlled, and he was again discharged home. The patient states that he had another severe bout of pain today. He states he does not have any pain medication, and the pain is not responding to conservative measures. He presents to the emergency department now for assistance with his left flank area pain. The patient does report one or 2 episodes of vomiting that he states was related to the severity of the pain. He is not had any high fever reported. He's not had any injury involving the left flank.  Patient is a 50 y.o. male presenting with flank pain. The history is provided by the patient.  Flank Pain This is a recurrent problem. The current episode started in the past 7 days. The problem occurs intermittently. The problem has been gradually worsening. Associated symptoms include arthralgias, nausea and vomiting. Pertinent negatives include no fever. Nothing aggravates the symptoms. He has tried nothing for the symptoms. The treatment provided no relief.    Past Medical History  Diagnosis Date  . Bipolar 1 disorder (HCC)   . Intermittent  explosive disorder   . Schizophrenia (HCC)   . Depression   . Bronchitis     hx  . History of kidney stones   . Open wound of right middle finger without damage to nail    Past Surgical History  Procedure Laterality Date  . Bunionectomy    . Shoulder surgery    . Vasectomy    . I&d extremity  10/30/2011    Procedure: IRRIGATION AND DEBRIDEMENT EXTREMITY;  Surgeon: Sharma Covert, MD;  Location: [facility] OR;  Service: Orthopedics;  Laterality: Right;  Wound exploration right middle finger with repair   No family history on file. Social History  Substance Use Topics  . Smoking status: Current Every Day Smoker -- 1.00 packs/day    Types: Cigarettes  . Smokeless tobacco: None  . Alcohol Use: No    Review of Systems  Constitutional: Negative for fever.  Gastrointestinal: Positive for nausea and vomiting.  Genitourinary: Positive for flank pain.  Musculoskeletal: Positive for arthralgias.  Psychiatric/Behavioral:       Bipolar illness  All other systems reviewed and are negative.     Allergies  Aspirin; Ibuprofen; and Tramadol  Home Medications   Prior to Admission medications   Medication Sig Start Date End Date Taking? Authorizing Provider  acetaminophen (TYLENOL) 500 MG tablet Take 1,000 mg by mouth 3 (three) times daily as needed. For pain    Historical Provider, MD  ALPRAZolam Prudy Feeler) 0.5 MG tablet Take 0.5 mg by mouth 4 (four) times daily as needed  for anxiety.     Historical Provider, MD  busPIRone (BUSPAR) 10 MG tablet Take 30 mg by mouth 2 (two) times daily.    Historical Provider, MD  citalopram (CELEXA) 20 MG tablet Take 20 mg by mouth daily.    Historical Provider, MD  divalproex (DEPAKOTE ER) 500 MG 24 hr tablet Take 1,500 mg by mouth daily. At 5pm    Historical Provider, MD  ondansetron (ZOFRAN ODT) 4 MG disintegrating tablet 4mg  ODT q4 hours prn nausea/vomit Patient not taking: Reported on 10/31/2015 10/26/15   Bethann BerkshireJoseph Zammit, MD  oxyCODONE-acetaminophen  (PERCOCET/ROXICET) 5-325 MG tablet Take 1 tablet by mouth every 6 (six) hours as needed. Patient taking differently: Take 1 tablet by mouth every 6 (six) hours as needed for moderate pain or severe pain.  10/26/15   Bethann BerkshireJoseph Zammit, MD  tamsulosin (FLOMAX) 0.4 MG CAPS capsule Take 1 capsule (0.4 mg total) by mouth daily. 10/26/15   Bethann BerkshireJoseph Zammit, MD   BP 133/84 mmHg  Pulse 84  Temp(Src) 97.9 F (36.6 C) (Oral)  Resp 18  Ht 5\' 11"  (1.803 m)  Wt 76.658 kg  BMI 23.58 kg/m2  SpO2 100% Physical Exam  Constitutional: He is oriented to person, place, and time. He appears well-developed and well-nourished.  Non-toxic appearance.  HENT:  Head: Normocephalic.  Right Ear: Tympanic membrane and external ear normal.  Left Ear: Tympanic membrane and external ear normal.  Eyes: EOM and lids are normal. Pupils are equal, round, and reactive to light.  Neck: Normal range of motion. Neck supple. Carotid bruit is not present.  Cardiovascular: Normal rate, regular rhythm, normal heart sounds, intact distal pulses and normal pulses.   Pulmonary/Chest: Breath sounds normal. No respiratory distress.  Abdominal: Soft. Bowel sounds are normal. There is tenderness. There is no guarding.  There is no mass or pulsatile mass appreciated. There is left CVA tenderness present. There is no distention noted.  Musculoskeletal: Normal range of motion.  Lymphadenopathy:       Head (right side): No submandibular adenopathy present.       Head (left side): No submandibular adenopathy present.    He has no cervical adenopathy.  Neurological: He is alert and oriented to person, place, and time. He has normal strength. No cranial nerve deficit or sensory deficit. Coordination normal.  Skin: Skin is warm and dry.  Psychiatric: He has a normal mood and affect. His speech is normal and behavior is normal. Thought content normal.  Nursing note and vitals reviewed.   ED Course  Procedures (including critical care time) Labs  Review Labs Reviewed - No data to display  Imaging Review No results found. I have personally reviewed and evaluated these images and lab results as part of my medical decision-making.   EKG Interpretation None      MDM  The vital signs have been reviewed. The previous emergency department visits, and previous imaging have been reviewed by me.  The basic metabolic panel shows the BUN to be slightly elevated at 22, otherwise within normal limits. The anion gap is normal at 10. The complete blood count shows the hemoglobin to be slightly lower than the previous visit at 12.3, and the hematocrit slightly lower at 35.7. The platelet count is well within normal limits at 241,000. The urinalysis shows a brown hazy specimen with a specific gravity of 1.025. There is a large hemoglobin present with too many to count red cells being present. There is 0-5 white blood cells present, and many bacteria. An  acute abdomen film was obtained. There is no evidence of acute pulmonary disease, there is a normal nonobstructive bowel gas pattern. There are multiple stones projected over both kidneys and the left ureter similar to the findings on the previous CT scan.  The patient's pain is down to a 2 on a 1-10 scale after IV Dilaudid and Toradol. The nausea has improved significantly after the promethazine.  I discussed the case with Dr. Ferd Hibbs (urology). There is not an indication for admission at this time. The urology specialist will see the patient on Tuesday or Wednesday, and develop a plan of action at that time. The urology consultant has asked that the patient be given medication to assist with his pain until that time.  The patient will be treated with Flomax, and Percocet. He will be given a strainer to strain all urine. The patient is been given instructions to monitor for signs of infection. The patient is in agreement with this discharge plan.    Final diagnoses:  None    **I have reviewed  nursing notes, vital signs, and all appropriate lab and imaging results for this patient.Ivery Quale, PA-C 11/03/15 1610  Rolland Porter, MD 11/13/15 1224

## 2015-11-02 NOTE — ED Notes (Signed)
Was seen last Thursday for kidney stones, [facility]. I can't get into see a urologist for a month.

## 2015-11-03 MED ORDER — TAMSULOSIN HCL 0.4 MG PO CAPS: 0.4000 mg | ORAL_CAPSULE | Freq: Every day | ORAL | Status: DC

## 2015-11-03 MED ORDER — PROMETHAZINE HCL 12.5 MG PO TABS: 12.5000 mg | ORAL_TABLET | Freq: Four times a day (QID) | ORAL | Status: DC | PRN

## 2015-11-03 MED ORDER — OXYCODONE-ACETAMINOPHEN 5-325 MG PO TABS: 1.0000 | ORAL_TABLET | Freq: Four times a day (QID) | ORAL | Status: DC | PRN

## 2015-11-03 NOTE — Discharge Instructions (Signed)
Your vital signs and stable range. Your complete blood count in basic metabolic panel are nonacute. There is no evidence of an infected stone noted on your testing. Please call [facility] tomorrow morning. But didn't know that Dr. Ferd HibbsMcKinsey once to see you on Tuesday or Wednesday. Usual medications as prescribed. The oxycodone, and the promethazine may cause drowsiness, please do not drink alcohol, drive a vehicle, operating machinery, or dissipated activities requiring concentration when taking these medications. Renal Colic Renal colic is pain that is caused by passing a kidney stone. The pain can be sharp and severe. It may be felt in the back, abdomen, side (flank), or groin. It can cause nausea. Renal colic can come and go. HOME CARE INSTRUCTIONS Watch your condition for any changes. The following actions may help to lessen any discomfort that you are feeling:  Take medicines only as directed by your health care provider.  Ask your health care provider if it is okay to take over-the-counter pain medicine.  Drink enough fluid to keep your urine clear or pale yellow. Drink 6-8 glasses of water each day.  Limit the amount of salt that you eat to less than 2 grams per day.  Reduce the amount of protein in your diet. Eat less meat, fish, nuts, and dairy.  Avoid foods such as spinach, rhubarb, nuts, or bran. These may make kidney stones more likely to form. SEEK MEDICAL CARE IF:  You have a fever or chills.  Your urine smells bad or looks cloudy.  You have pain or burning when you pass urine. SEEK IMMEDIATE MEDICAL CARE IF:  Your flank pain or groin pain suddenly worsens.  You become confused or disoriented or you lose consciousness.   This information is not intended to replace advice given to you by your health care provider. Make sure you discuss any questions you have with your health care provider.   Document Released: 04/10/2005 Document Revised: 07/22/2014 Document  Reviewed: 05/11/2014 Elsevier Interactive Patient Education [employer]2016 Elsevier Inc.

## 2015-11-08 ENCOUNTER — Ambulatory Visit (INDEPENDENT_AMBULATORY_CARE_PROVIDER_SITE_OTHER): Payer: Self-pay | Admitting: Urology

## 2015-11-08 DIAGNOSIS — N200 Calculus of kidney: Secondary | ICD-10-CM

## 2015-11-10 ENCOUNTER — Encounter (HOSPITAL_COMMUNITY): Payer: Self-pay

## 2015-11-10 ENCOUNTER — Encounter (HOSPITAL_COMMUNITY)
Admission: RE | Admit: 2015-11-10 | Discharge: 2015-11-10 | Disposition: A | Discharge status: Home / Self Care | Payer: Self-pay | Source: Ambulatory Visit | Attending: Urology | Admitting: Urology

## 2015-11-10 ENCOUNTER — Other Ambulatory Visit: Payer: Self-pay

## 2015-11-10 DIAGNOSIS — N201 Calculus of ureter: Secondary | ICD-10-CM

## 2015-11-10 DIAGNOSIS — Z01818 Encounter for other preprocedural examination: Secondary | ICD-10-CM | POA: Insufficient documentation

## 2015-11-10 HISTORY — DX: Chronic obstructive pulmonary disease, unspecified: J44.9

## 2015-11-10 NOTE — Patient Instructions (Signed)
Luke Craig [facility]  11/10/2015     @   Your procedure is scheduled on  11/15/2015   Report to [facility] at  930  A.M.  Call this number if you have problems the morning of surgery:  [phone]   Remember:  Do not eat food or drink liquids after midnight.  Take these medicines the morning of surgery with A SIP OF WATER  Xanax, buspar, celexa, oxycodone, phenergan.   Do not wear jewelry, make-up or nail polish.  Do not wear lotions, powders, or perfumes.  You may wear deodorant.  Do not shave 48 hours prior to surgery.  Men may shave face and neck.  Do not bring valuables to the hospital.  [facility] is not responsible for any belongings or valuables.  Contacts, dentures or bridgework may not be worn into surgery.  Leave your suitcase in the car.  After surgery it may be brought to your room.  For patients admitted to the hospital, discharge time will be determined by your treatment team.  Patients discharged the day of surgery will not be allowed to drive home.   Name and phone number of your driver:   family Special instructions:  none  Please read over the following fact sheets that you were given. Coughing and Deep Breathing, Surgical Site Infection Prevention, Anesthesia Post-op Instructions and Care and Recovery After Surgery      Cystoscopy Cystoscopy is a procedure that is used to help your caregiver diagnose and sometimes treat conditions that affect your lower urinary tract. Your lower urinary tract includes your bladder and the tube through which urine passes from your bladder out of your body (urethra). Cystoscopy is performed with a thin, tube-shaped instrument (cystoscope). The cystoscope has lenses and a light at the end so that your caregiver can see inside your bladder. The cystoscope is inserted at the entrance of your urethra. Your caregiver guides it through your urethra and into your bladder. There are two main types of  cystoscopy:  Flexible cystoscopy (with a flexible cystoscope).  Rigid cystoscopy (with a rigid cystoscope). Cystoscopy may be recommended for many conditions, including:  Urinary tract infections.  Blood in your urine (hematuria).  Loss of bladder control (urinary incontinence) or overactive bladder.  Unusual cells found in a urine sample.  Urinary blockage.  Painful urination. Cystoscopy may also be done to remove a sample of your tissue to be checked under a microscope (biopsy). It may also be done to remove or destroy bladder stones. LET YOUR CAREGIVER KNOW ABOUT:  Allergies to food or medicine.  Medicines taken, including vitamins, herbs, eyedrops, over-the-counter medicines, and creams.  Use of steroids (by mouth or creams).  Previous problems with anesthetics or numbing medicines.  History of bleeding problems or blood clots.  Previous surgery.  Other health problems, including diabetes and kidney problems.  Possibility of pregnancy, if this applies. PROCEDURE The area around the opening to your urethra will be cleaned. A medicine to numb your urethra (local anesthetic) is used. If a tissue sample or stone is removed during the procedure, you may be given a medicine to make you sleep (general anesthetic). Your caregiver will gently insert the tip of the cystoscope into your urethra. The cystoscope will be slowly glided through your urethra and into your bladder. Sterile fluid will flow through the cystoscope and into your bladder. The fluid will expand and stretch your bladder. This gives your caregiver a better view  of your bladder walls. The procedure lasts about 15-20 minutes. AFTER THE PROCEDURE If a local anesthetic is used, you will be allowed to go home as soon as you are ready. If a general anesthetic is used, you will be taken to a recovery area until you are stable. You may have temporary bleeding and burning on urination.   This information is not intended  to replace advice given to you by your health care provider. Make sure you discuss any questions you have with your health care provider.   Document Released: 06/28/2000 Document Revised: 07/22/2014 Document Reviewed: 12/23/2011 Elsevier Interactive Patient Education 2016 Elsevier Inc. Cystoscopy, Care After Refer to this sheet in the next few weeks. These instructions provide you with information on caring for yourself after your procedure. Your caregiver may also give you more specific instructions. Your treatment has been planned according to current medical practices, but problems sometimes occur. Call your caregiver if you have any problems or questions after your procedure. HOME CARE INSTRUCTIONS  Things you can do to ease any discomfort after your procedure include:  Drinking enough water and fluids to keep your urine clear or pale yellow.  Taking a warm bath to relieve any burning feelings. SEEK IMMEDIATE MEDICAL CARE IF:   You have an increase in blood in your urine.  You notice blood clots in your urine.  You have difficulty passing urine.  You have the chills.  You have abdominal pain.  You have a fever or persistent symptoms for more than 2-3 days.  You have a fever and your symptoms suddenly get worse. MAKE SURE YOU:   Understand these instructions.  Will watch your condition.  Will get help right away if you are not doing well or get worse.   This information is not intended to replace advice given to you by your health care provider. Make sure you discuss any questions you have with your health care provider.   Document Released: 01/18/2005 Document Revised: 07/22/2014 Document Reviewed: 12/23/2011 Elsevier Interactive Patient Education 2016 Elsevier Inc. PATIENT INSTRUCTIONS POST-ANESTHESIA  IMMEDIATELY FOLLOWING SURGERY:  Do not drive or operate machinery for the first twenty four hours after surgery.  Do not make any important decisions for twenty four  hours after surgery or while taking narcotic pain medications or sedatives.  If you develop intractable nausea and vomiting or a severe headache please notify your doctor immediately.  FOLLOW-UP:  Please make an appointment with your surgeon as instructed. You do not need to follow up with anesthesia unless specifically instructed to do so.  WOUND CARE INSTRUCTIONS (if applicable):  Keep a dry clean dressing on the anesthesia/puncture wound site if there is drainage.  Once the wound has quit draining you may leave it open to air.  Generally you should leave the bandage intact for twenty four hours unless there is drainage.  If the epidural site drains for more than 36-48 hours please call the anesthesia department.  QUESTIONS?:  Please feel free to call your physician or the hospital operator if you have any questions, and they will be happy to assist you.

## 2015-11-10 NOTE — Pre-Procedure Instructions (Signed)
Patient given information to sign up for my chart at home. 

## 2015-11-15 ENCOUNTER — Encounter (HOSPITAL_COMMUNITY): Payer: Self-pay | Admitting: *Deleted

## 2015-11-15 ENCOUNTER — Encounter (HOSPITAL_COMMUNITY)
Admission: RE | Disposition: A | Discharge status: Home / Self Care | Payer: Self-pay | Source: Ambulatory Visit | Attending: Urology

## 2015-11-15 ENCOUNTER — Ambulatory Visit (HOSPITAL_COMMUNITY): Payer: Self-pay | Admitting: Anesthesiology

## 2015-11-15 ENCOUNTER — Ambulatory Visit (HOSPITAL_COMMUNITY)
Admission: RE | Admit: 2015-11-15 | Discharge: 2015-11-15 | Disposition: A | Discharge status: Home / Self Care | Payer: Self-pay | Source: Ambulatory Visit | Attending: Urology | Admitting: Urology

## 2015-11-15 ENCOUNTER — Ambulatory Visit (HOSPITAL_COMMUNITY): Payer: Self-pay

## 2015-11-15 DIAGNOSIS — F209 Schizophrenia, unspecified: Secondary | ICD-10-CM | POA: Insufficient documentation

## 2015-11-15 DIAGNOSIS — J449 Chronic obstructive pulmonary disease, unspecified: Secondary | ICD-10-CM | POA: Insufficient documentation

## 2015-11-15 DIAGNOSIS — F319 Bipolar disorder, unspecified: Secondary | ICD-10-CM | POA: Insufficient documentation

## 2015-11-15 DIAGNOSIS — Z79899 Other long term (current) drug therapy: Secondary | ICD-10-CM | POA: Insufficient documentation

## 2015-11-15 DIAGNOSIS — N201 Calculus of ureter: Secondary | ICD-10-CM | POA: Insufficient documentation

## 2015-11-15 DIAGNOSIS — F329 Major depressive disorder, single episode, unspecified: Secondary | ICD-10-CM | POA: Insufficient documentation

## 2015-11-15 DIAGNOSIS — F17210 Nicotine dependence, cigarettes, uncomplicated: Secondary | ICD-10-CM | POA: Insufficient documentation

## 2015-11-15 HISTORY — PX: CYSTOSCOPY/URETEROSCOPY/HOLMIUM LASER: SHX6545

## 2015-11-15 HISTORY — PX: CYSTOSCOPY W/ URETERAL STENT PLACEMENT: SHX1429

## 2015-11-15 SURGERY — CYSTOSCOPY, WITH RETROGRADE PYELOGRAM AND URETERAL STENT INSERTION
Anesthesia: General | Laterality: Left

## 2015-11-15 MED ORDER — SODIUM CHLORIDE 0.9 % IV SOLN
INTRAVENOUS | Status: DC | PRN
  Administered 2015-11-15: 5 mL

## 2015-11-15 MED ORDER — LIDOCAINE HCL (CARDIAC) 10 MG/ML IV SOLN
INTRAVENOUS | Status: DC | PRN
  Administered 2015-11-15: 50 mg via INTRAVENOUS

## 2015-11-15 MED ORDER — OXYCODONE-ACETAMINOPHEN 5-325 MG PO TABS: 1.0000 | ORAL_TABLET | Freq: Four times a day (QID) | ORAL | Status: DC | PRN

## 2015-11-15 MED ORDER — LIDOCAINE HCL (PF) 1 % IJ SOLN
INTRAMUSCULAR | Status: AC
  Filled 2015-11-15: qty 5

## 2015-11-15 MED ORDER — SODIUM CHLORIDE 0.9 % IR SOLN
Status: DC | PRN
  Administered 2015-11-15: 3000 mL

## 2015-11-15 MED ORDER — STERILE WATER FOR IRRIGATION IR SOLN
Status: DC | PRN
  Administered 2015-11-15: 3000 mL

## 2015-11-15 MED ORDER — ONDANSETRON HCL 4 MG/2ML IJ SOLN: 4.0000 mg | Freq: Once | INTRAMUSCULAR | Status: DC | PRN

## 2015-11-15 MED ORDER — MIDAZOLAM HCL 2 MG/2ML IJ SOLN
INTRAMUSCULAR | Status: AC
  Filled 2015-11-15: qty 2

## 2015-11-15 MED ORDER — FENTANYL CITRATE (PF) 100 MCG/2ML IJ SOLN
INTRAMUSCULAR | Status: AC
Start: 2015-11-15 — End: 2015-11-15
  Filled 2015-11-15: qty 2

## 2015-11-15 MED ORDER — ONDANSETRON HCL 4 MG/2ML IJ SOLN
4.0000 mg | Freq: Once | INTRAMUSCULAR | Status: AC
  Administered 2015-11-15: 4 mg via INTRAVENOUS

## 2015-11-15 MED ORDER — PROMETHAZINE HCL 12.5 MG PO TABS: 12.5000 mg | ORAL_TABLET | Freq: Four times a day (QID) | ORAL | Status: DC | PRN

## 2015-11-15 MED ORDER — FENTANYL CITRATE (PF) 100 MCG/2ML IJ SOLN
25.0000 ug | INTRAMUSCULAR | Status: AC
  Administered 2015-11-15 (×2): 25 ug via INTRAVENOUS

## 2015-11-15 MED ORDER — PROPOFOL 10 MG/ML IV BOLUS
INTRAVENOUS | Status: AC
  Filled 2015-11-15: qty 20

## 2015-11-15 MED ORDER — CEFAZOLIN SODIUM 1-5 GM-% IV SOLN
1.0000 g | INTRAVENOUS | Status: AC
  Administered 2015-11-15: 1 g via INTRAVENOUS
  Filled 2015-11-15: qty 50

## 2015-11-15 MED ORDER — FENTANYL CITRATE (PF) 100 MCG/2ML IJ SOLN
INTRAMUSCULAR | Status: AC
  Filled 2015-11-15: qty 2

## 2015-11-15 MED ORDER — STERILE WATER FOR IRRIGATION IR SOLN
Status: DC | PRN
  Administered 2015-11-15: 1000 mL

## 2015-11-15 MED ORDER — LACTATED RINGERS IV SOLN
INTRAVENOUS | Status: DC
  Administered 2015-11-15: 10:00:00 via INTRAVENOUS

## 2015-11-15 MED ORDER — ONDANSETRON HCL 4 MG/2ML IJ SOLN
INTRAMUSCULAR | Status: AC
  Filled 2015-11-15: qty 2

## 2015-11-15 MED ORDER — PROPOFOL 10 MG/ML IV BOLUS
INTRAVENOUS | Status: DC | PRN
  Administered 2015-11-15: 150 mg via INTRAVENOUS

## 2015-11-15 MED ORDER — MIDAZOLAM HCL 2 MG/2ML IJ SOLN
1.0000 mg | INTRAMUSCULAR | Status: DC | PRN
  Administered 2015-11-15 (×2): 1 mg via INTRAVENOUS

## 2015-11-15 MED ORDER — FENTANYL CITRATE (PF) 100 MCG/2ML IJ SOLN
INTRAMUSCULAR | Status: DC | PRN
  Administered 2015-11-15: 50 ug via INTRAVENOUS

## 2015-11-15 MED ORDER — FENTANYL CITRATE (PF) 100 MCG/2ML IJ SOLN: 25.0000 ug | INTRAMUSCULAR | Status: DC | PRN

## 2015-11-15 MED ORDER — TAMSULOSIN HCL 0.4 MG PO CAPS: 0.4000 mg | ORAL_CAPSULE | Freq: Every day | ORAL | Status: DC

## 2015-11-15 SURGICAL SUPPLY — 28 items
BAG DRAIN URO TABLE W/ADPT NS (DRAPE) ×2 IMPLANT
BAG DRN 8 ADPR NS SKTRN CSTL (DRAPE) ×1
BAG HAMPER (MISCELLANEOUS) ×2 IMPLANT
CATH INTERMIT  6FR 70CM (CATHETERS) ×1 IMPLANT
CLOTH BEACON ORANGE TIMEOUT ST (SAFETY) ×2 IMPLANT
EXTRACTOR STONE NITINOL NGAGE (UROLOGICAL SUPPLIES) ×1 IMPLANT
GLOVE BIO SURGEON STRL SZ8 (GLOVE) ×2 IMPLANT
GLOVE BIOGEL PI IND STRL 6.5 (GLOVE) IMPLANT
GLOVE BIOGEL PI INDICATOR 6.5 (GLOVE) ×1
GLOVE ECLIPSE 6.5 STRL STRAW (GLOVE) ×1 IMPLANT
GLOVE EXAM NITRILE MD LF STRL (GLOVE) ×1 IMPLANT
GOWN STRL REUS W/ TWL LRG LVL3 (GOWN DISPOSABLE) ×1 IMPLANT
GOWN STRL REUS W/ TWL XL LVL3 (GOWN DISPOSABLE) ×1 IMPLANT
GOWN STRL REUS W/TWL LRG LVL3 (GOWN DISPOSABLE) ×2
GOWN STRL REUS W/TWL XL LVL3 (GOWN DISPOSABLE) ×2
GUIDEWIRE STR DUAL SENSOR (WIRE) ×1 IMPLANT
GUIDEWIRE STR ZIPWIRE 035X150 (MISCELLANEOUS) ×2 IMPLANT
IV NS IRRIG 3000ML ARTHROMATIC (IV SOLUTION) ×2 IMPLANT
LASER FIBER DISP (UROLOGICAL SUPPLIES) ×1 IMPLANT
MANIFOLD NEPTUNE II (INSTRUMENTS) ×1 IMPLANT
PACK CYSTO (CUSTOM PROCEDURE TRAY) ×2 IMPLANT
PAD ARMBOARD 7.5X6 YLW CONV (MISCELLANEOUS) ×2 IMPLANT
STENT URET 6FRX26 CONTOUR (STENTS) IMPLANT
SYRINGE 10CC LL (SYRINGE) ×2 IMPLANT
TOWEL OR 17X26 4PK STRL BLUE (TOWEL DISPOSABLE) ×2 IMPLANT
TRAY FOLEY CATH SILVER 16FR (SET/KITS/TRAYS/PACK) ×1 IMPLANT
TUBE FEEDING 8FR 16IN STR KANG (MISCELLANEOUS) IMPLANT
WATER STERILE IRR 3000ML UROMA (IV SOLUTION) ×1 IMPLANT

## 2015-11-15 NOTE — Brief Op Note (Signed)
11/15/2015  12:33 PM  PATIENT:  Everlean CherryHarold R Clontz  50 y.o. male  PRE-OPERATIVE DIAGNOSIS:  left ureteral stone  POST-OPERATIVE DIAGNOSIS:  left ureteral stone  PROCEDURE:  Procedure(s): CYSTOSCOPY WITH RETROGRADE PYELOGRAM/URETERAL STENT PLACEMENT (Left) CYSTOSCOPY/STONE EXTRACTION WITH HOLMIUM LASER (Left)  SURGEON:  Surgeon(s) and Role:    * Malen GauzePatrick L McKenzie, MD - Primary  PHYSICIAN ASSISTANT:   ASSISTANTS: none   ANESTHESIA:   general  EBL:  Total I/O In: 700 [I.V.:700] Out: -   BLOOD ADMINISTERED:none  DRAINS: left 6x26 JJ ureteral stent  LOCAL MEDICATIONS USED:  NONE  SPECIMEN:  Source of Specimen:  left ureteral calculus  DISPOSITION OF SPECIMEN:  PATHOLOGY  COUNTS:  YES  TOURNIQUET:  * No tourniquets in log *  DICTATION: .Note written in EPIC  PLAN OF CARE: Discharge to home after PACU  PATIENT DISPOSITION:  PACU - hemodynamically stable.   Delay start of Pharmacological VTE agent (>24hrs) due to surgical blood loss or risk of bleeding: not applicable

## 2015-11-15 NOTE — H&P (Signed)
Urology Admission H&P  Chief Complaint: left flank pain  History of Present Illness: Luke Craig is a 50yo with a left ureteral calculus. He has failed MET and wishes to proceed with surgery. He denies any LUTS. He denies hematuria  Past Medical History  Diagnosis Date  . Bipolar 1 disorder ([place])   . Intermittent explosive disorder   . Schizophrenia ([place])   . Depression   . Bronchitis     hx  . History of kidney stones   . Open wound of right middle finger without damage to nail   . COPD (chronic obstructive pulmonary disease) ([place])   . History of kidney stones    Past Surgical History  Procedure Laterality Date  . Bunionectomy Bilateral   . Shoulder surgery Right     separated shoulder  . Vasectomy    . I&d extremity  10/30/2011    Procedure: IRRIGATION AND DEBRIDEMENT EXTREMITY;  Surgeon: Linna Hoff, MD;  Location: [facility];  Service: Orthopedics;  Laterality: Right;  Wound exploration right middle finger with repair    Home Medications:  Prescriptions prior to admission  Medication Sig Dispense Refill Last Dose  . acetaminophen (TYLENOL) 500 MG tablet Take 1,000 mg by mouth 3 (three) times daily as needed. For pain   Past Month at Unknown time  . ALPRAZolam (XANAX) 0.5 MG tablet Take 0.5 mg by mouth 4 (four) times daily as needed for anxiety.    11/15/2015 at Unknown time  . busPIRone (BUSPAR) 10 MG tablet Take 30 mg by mouth 2 (two) times daily.   11/14/2015 at Unknown time  . citalopram (CELEXA) 20 MG tablet Take 20 mg by mouth daily.   11/15/2015 at Unknown time  . divalproex (DEPAKOTE ER) 500 MG 24 hr tablet Take 1,500 mg by mouth daily. At 5pm   11/14/2015 at Unknown time  . oxyCODONE-acetaminophen (PERCOCET/ROXICET) 5-325 MG tablet Take 1 tablet by mouth every 6 (six) hours as needed. 24 tablet 0 11/15/2015 at Unknown time  . promethazine (PHENERGAN) 12.5 MG tablet Take 1 tablet (12.5 mg total) by mouth every 6 (six) hours as needed for nausea or vomiting. 12 tablet 0 11/14/2015 at  Unknown time  . tamsulosin (FLOMAX) 0.4 MG CAPS capsule Take 1 capsule (0.4 mg total) by mouth at bedtime. 7 capsule 0 11/14/2015 at Unknown time  . ondansetron (ZOFRAN ODT) 4 MG disintegrating tablet 64m ODT q4 hours prn nausea/vomit (Patient not taking: Reported on 11/13/2015) 12 tablet 0 Not Taking at Unknown time   Allergies:  Allergies  Allergen Reactions  . Aspirin Other (See Comments)    Upset stomach  . Ibuprofen     Stomach upset  . Tramadol Nausea And Vomiting    History reviewed. No pertinent family history. Social History:  reports that he has been smoking Cigarettes.  He has a 52.5 pack-year smoking history. He does not have any smokeless tobacco history on file. He reports that he does not drink alcohol or use illicit drugs.  Review of Systems  Genitourinary: Positive for flank pain.  All other systems reviewed and are negative.   Physical Exam:  Vital signs in last 24 hours: Pulse Rate:  [81] 81 (05/03 0944) Resp:  [13-26] 16 (05/03 1030) BP: (96-114)/(63-80) 108/65 mmHg (05/03 1030) SpO2:  [92 %-98 %] 98 % (05/03 1030) Physical Exam  Constitutional: He is oriented to person, place, and time. He appears well-developed and well-nourished.  HENT:  Head: Normocephalic and atraumatic.  Eyes: EOM are normal. Pupils  are equal, round, and reactive to light.  Neck: Normal range of motion. No thyromegaly present.  Cardiovascular: Normal rate and regular rhythm.   Respiratory: Effort normal. No respiratory distress.  GI: Soft. He exhibits no distension and no mass. There is no tenderness. There is no rebound and no guarding.  Musculoskeletal: Normal range of motion.  Neurological: He is alert and oriented to person, place, and time.  Skin: Skin is warm and dry.  Psychiatric: He has a normal mood and affect. His behavior is normal. Judgment and thought content normal.    Laboratory Data:  No results found for this or any previous visit (from the past 24 hour(s)). No  results found for this or any previous visit (from the past 240 hour(s)). Creatinine: No results for input(s): CREATININE in the last 168 hours. Baseline Creatinine: inknown  Impression/Assessment:  49yo with left ureteral calculus  Plan:  The risks/benefits/alternatives to left ureteroscopic stone extraction was explained to the patient and he understands and wishes to proceed with surgery  Nicolette Bang 11/15/2015, 11:34 AM

## 2015-11-15 NOTE — Anesthesia Procedure Notes (Signed)
Procedure Name: LMA Insertion Date/Time: 11/15/2015 11:48 AM Performed by: Franco NonesYATES, Addison Whidbee S Pre-anesthesia Checklist: Patient identified, Patient being monitored, Emergency Drugs available, Timeout performed and Suction available Patient Re-evaluated:Patient Re-evaluated prior to inductionOxygen Delivery Method: Circle System Utilized Preoxygenation: Pre-oxygenation with 100% oxygen Intubation Type: IV induction Ventilation: Mask ventilation without difficulty LMA: LMA inserted LMA Size: 4.0 Number of attempts: 1 Placement Confirmation: positive ETCO2 and breath sounds checked- equal and bilateral Tube secured with: Tape Dental Injury: Teeth and Oropharynx as per pre-operative assessment

## 2015-11-15 NOTE — Anesthesia Postprocedure Evaluation (Signed)
Anesthesia Post Note  Patient: Luke CherryHarold R Perlman  Procedure(s) Performed: Procedure(s) (LRB): CYSTOSCOPY WITH RETROGRADE PYELOGRAM/URETERAL STENT PLACEMENT/URETEROSCOPY/STONE BASKET EXTRACTION (Left) CYSTOSCOPY/STONE EXTRACTION WITH HOLMIUM LASER (Left)  Patient location during evaluation: PACU Anesthesia Type: General Level of consciousness: awake and alert, oriented and patient cooperative Pain management: pain level controlled Vital Signs Assessment: post-procedure vital signs reviewed and stable Respiratory status: spontaneous breathing, nonlabored ventilation and respiratory function stable Cardiovascular status: blood pressure returned to baseline Postop Assessment: no signs of nausea or vomiting Anesthetic complications: no    Last Vitals:  Filed Vitals:   11/15/15 1300 11/15/15 1315  BP: 131/99 130/99  Pulse: 70 71  Temp:    Resp: 17 11    Last Pain:  Filed Vitals:   11/15/15 1321  PainSc: 3                  Chancelor Hardrick J

## 2015-11-15 NOTE — Anesthesia Preprocedure Evaluation (Signed)
Anesthesia Evaluation  Patient identified by MRN, date of birth, ID band Patient awake    Reviewed: Allergy & Precautions, H&P , NPO status , Patient's Chart, lab work & pertinent test results, reviewed documented beta blocker date and time   Airway Mallampati: I  TM Distance: >3 FB Neck ROM: Full    Dental  (+) Edentulous Upper, Edentulous Lower   Pulmonary COPD, Current Smoker,    Pulmonary exam normal        Cardiovascular negative cardio ROS Normal cardiovascular exam     Neuro/Psych PSYCHIATRIC DISORDERS ( Intermittent explosive disorder) Depression Bipolar Disorder Schizophrenia    GI/Hepatic negative GI ROS,   Endo/Other    Renal/GU      Musculoskeletal   Abdominal   Peds  Hematology   Anesthesia Other Findings   Reproductive/Obstetrics                             Anesthesia Physical Anesthesia Plan  ASA: III  Anesthesia Plan: General   Post-op Pain Management:    Induction: Intravenous  Airway Management Planned: LMA  Additional Equipment:   Intra-op Plan:   Post-operative Plan: Extubation in OR  Informed Consent: I have reviewed the patients History and Physical, chart, labs and discussed the procedure including the risks, benefits and alternatives for the proposed anesthesia with the patient or authorized representative who has indicated his/her understanding and acceptance.     Plan Discussed with:   Anesthesia Plan Comments:         Anesthesia Quick Evaluation

## 2015-11-15 NOTE — Discharge Instructions (Signed)
Kidney Stones Kidney stones (urolithiasis) are deposits that form inside your kidneys. The intense pain is caused by the stone moving through the urinary tract. When the stone moves, the ureter goes into spasm around the stone. The stone is usually passed in the urine.  CAUSES   A disorder that makes certain neck glands produce too much parathyroid hormone (primary hyperparathyroidism).  A buildup of uric acid crystals, similar to gout in your joints.  Narrowing (stricture) of the ureter.  A kidney obstruction present at birth (congenital obstruction).  Previous surgery on the kidney or ureters.  Numerous kidney infections. SYMPTOMS   Feeling sick to your stomach (nauseous).  Throwing up (vomiting).  Blood in the urine (hematuria).  Pain that usually spreads (radiates) to the groin.  Frequency or urgency of urination. DIAGNOSIS   Taking a history and physical exam.  Blood or urine tests.  CT scan.  Occasionally, an examination of the inside of the urinary bladder (cystoscopy) is performed. TREATMENT   Observation.  Increasing your fluid intake.  Extracorporeal shock wave lithotripsy--This is a noninvasive procedure that uses shock waves to break up kidney stones.  Surgery may be needed if you have severe pain or persistent obstruction. There are various surgical procedures. Most of the procedures are performed with the use of small instruments. Only small incisions are needed to accommodate these instruments, so recovery time is minimized. The size, location, and chemical composition are all important variables that will determine the proper choice of action for you. Talk to your health care provider to better understand your situation so that you will minimize the risk of injury to yourself and your kidney.  HOME CARE INSTRUCTIONS   Drink enough water and fluids to keep your urine clear or pale yellow. This will help you to pass the stone or stone fragments.  Strain  all urine through the provided strainer. Keep all particulate matter and stones for your health care provider to see. The stone causing the pain may be as small as a grain of salt. It is very important to use the strainer each and every time you pass your urine. The collection of your stone will allow your health care provider to analyze it and verify that a stone has actually passed. The stone analysis will often identify what you can do to reduce the incidence of recurrences.  Only take over-the-counter or prescription medicines for pain, discomfort, or fever as directed by your health care provider.  Keep all follow-up visits as told by your health care provider. This is important.  Get follow-up X-rays if required. The absence of pain does not always mean that the stone has passed. It may have only stopped moving. If the urine remains completely obstructed, it can cause loss of kidney function or even complete destruction of the kidney. It is your responsibility to make sure X-rays and follow-ups are completed. Ultrasounds of the kidney can show blockages and the status of the kidney. Ultrasounds are not associated with any radiation and can be performed easily in a matter of minutes.  Make changes to your daily diet as told by your health care provider. You may be told to:  Limit the amount of salt that you eat.  Eat 5 or more servings of fruits and vegetables each day.  Limit the amount of meat, poultry, fish, and eggs that you eat.  Collect a 24-hour urine sample as told by your health care provider.You may need to collect another urine sample every 6-12  months. SEEK MEDICAL CARE IF:  You experience pain that is progressive and unresponsive to any pain medicine you have been prescribed. SEEK IMMEDIATE MEDICAL CARE IF:   Pain cannot be controlled with the prescribed medicine.  You have a fever or shaking chills.  The severity or intensity of pain increases over 18 hours and is not  relieved by pain medicine.  You develop a new onset of abdominal pain.  You feel faint or pass out.  You are unable to urinate.   This information is not intended to replace advice given to you by your health care provider. Make sure you discuss any questions you have with your health care provider.   Document Released: 07/01/2005 Document Revised: 03/22/2015 Document Reviewed: 12/02/2012 Elsevier Interactive Patient Education 2016 Elsevier Inc.  Intravenous Pyelogram, Care After These instructions give you information about caring for yourself after your procedure. Your doctor may also give you more specific instructions. Call your doctor if you have any problems or questions after your procedure. HOME CARE INSTRUCTIONS  Return to your normal activities as told by your doctor. Ask your doctor what activities are safe for you.  Drink enough fluid to keep your pee (urine) clear or pale yellow.  It is your responsibility to get the results of your procedure. Ask your doctor when your results will be ready. GET HELP IF: You start peeing less than you usually do. GET HELP RIGHT AWAY IF:  You feel sick to your stomach (are nauseous).  You throw up (vomit).  You have itching.  You have trouble breathing.  Your throat swells.  You have chest pain.  You have chills or a fever.   This information is not intended to replace advice given to you by your health care provider. Make sure you discuss any questions you have with your health care provider.   Document Released: 03/22/2015 Document Reviewed: 09/15/2014 Elsevier Interactive Patient Education 2016 [employer].  Dietary Guidelines to Help Prevent Kidney Stones Your risk of kidney stones can be decreased by adjusting the foods you eat. The most important thing you can do is drink enough fluid. You should drink enough fluid to keep your urine clear or pale yellow. The following guidelines provide specific information for the  type of kidney stone you have had. GUIDELINES ACCORDING TO TYPE OF KIDNEY STONE Calcium Oxalate Kidney Stones  Reduce the amount of salt you eat. Foods that have a lot of salt cause your body to release excess calcium into your urine. The excess calcium can combine with a substance called oxalate to form kidney stones.  Reduce the amount of animal protein you eat if the amount you eat is excessive. Animal protein causes your body to release excess calcium into your urine. Ask your dietitian how much protein from animal sources you should be eating.  Avoid foods that are high in oxalates. If you take vitamins, they should have less than 500 mg of vitamin C. Your body turns vitamin C into oxalates. You do not need to avoid fruits and vegetables high in vitamin C. Calcium Phosphate Kidney Stones  Reduce the amount of salt you eat to help prevent the release of excess calcium into your urine.  Reduce the amount of animal protein you eat if the amount you eat is excessive. Animal protein causes your body to release excess calcium into your urine. Ask your dietitian how much protein from animal sources you should be eating.  Get enough calcium from food or take a  calcium supplement (ask your dietitian for recommendations). Food sources of calcium that do not increase your risk of kidney stones include:  Broccoli.  Dairy products, such as cheese and yogurt.  Pudding. Uric Acid Kidney Stones  Do not have more than 6 oz of animal protein per day. FOOD SOURCES Animal Protein Sources  Meat (all types).  Poultry.  Eggs.  Fish, seafood. Foods High in MirantSalt  Salt seasonings.  Soy sauce.  Teriyaki sauce.  Cured and processed meats.  Salted crackers and snack foods.  Fast food.  Canned soups and most canned foods. Foods High in Oxalates  Grains:  Amaranth.  Barley.  Grits.  Wheat germ.  Bran.  Buckwheat flour.  All bran cereals.  Pretzels.  Whole wheat  bread.  Vegetables:  Beans (wax).  Beets and beet greens.  Collard greens.  Eggplant.  Escarole.  Leeks.  Okra.  Parsley.  Rutabagas.  Spinach.  Swiss chard.  Tomato paste.  Fried potatoes.  Sweet potatoes.  Fruits:  Red currants.  Figs.  Kiwi.  Rhubarb.  Meat and Other Protein Sources:  Beans (dried).  Soy burgers and other soybean products.  Miso.  Nuts (peanuts, almonds, pecans, cashews, hazelnuts).  Nut butters.  Sesame seeds and tahini (paste made of sesame seeds).  Poppy seeds.  Beverages:  Chocolate drink mixes.  Soy milk.  Instant iced tea.  Juices made from high-oxalate fruits or vegetables.  Other:  Carob.  Chocolate.  Fruitcake.  Marmalades.   This information is not intended to replace advice given to you by your health care provider. Make sure you discuss any questions you have with your health care provider.   Document Released: 10/26/2010 Document Revised: 07/06/2013 Document Reviewed: 05/28/2013 Elsevier Interactive Patient Education [employer]2016 Elsevier Inc.

## 2015-11-15 NOTE — Transfer of Care (Signed)
Immediate Anesthesia Transfer of Care Note  Patient: Luke CherryHarold R Tribbey  Procedure(s) Performed: Procedure(s): CYSTOSCOPY WITH RETROGRADE PYELOGRAM/URETERAL STENT PLACEMENT/URETEROSCOPY/STONE BASKET EXTRACTION (Left) CYSTOSCOPY/STONE EXTRACTION WITH HOLMIUM LASER (Left)  Patient Location: PACU  Anesthesia Type:General  Level of Consciousness: awake and patient cooperative  Airway & Oxygen Therapy: Patient Spontanous Breathing and non-rebreather face mask  Post-op Assessment: Report given to RN, Post -op Vital signs reviewed and stable and Patient moving all extremities  Post vital signs: Reviewed and stable  Last Vitals:  Filed Vitals:   11/15/15 1130 11/15/15 1135  BP: 94/69   Pulse:    Resp: 18 20    Last Pain: There were no vitals filed for this visit.    Patients Stated Pain Goal: 5 (11/15/15 0944)  Complications: No apparent anesthesia complications

## 2015-11-16 ENCOUNTER — Encounter (HOSPITAL_COMMUNITY): Payer: Self-pay | Admitting: Urology

## 2015-11-20 NOTE — Op Note (Signed)
.  Preoperative diagnosis: Left ureteral stone  Postoperative diagnosis: Same  Procedure: 1 cystoscopy 2. Left retrograde pyelography 3.  Intraoperative fluoroscopy, under one hour, with interpretation 4.  Left ureteroscopic stone manipulation with laser lithotripsy 5.  Left 6 x 26 JJ stent placement  Attending: Cleda MccreedyPatrick Mackenzie  Anesthesia: General  Estimated blood loss: None  Drains: Left 6 x 26 JJ ureteral stent with tether  Specimens: stone for analysis  Antibiotics: ancef  Findings: left proximal ureteral stone. Moderate hydronephrosis. No masses/lesions in the bladder. Ureteral orifices in normal anatomic location.  Indications: Patient is a 50 year old male with a history of left ureteral stone and who has persistent left flank pain.  After discussing treatment options, he decided proceed with left ureteroscopic stone manipulation.  Procedure her in detail: The patient was brought to the operating room and a brief timeout was done to ensure correct patient, correct procedure, correct site.  General anesthesia was administered patient was placed in dorsal lithotomy position.  Her genitalia was then prepped and draped in usual sterile fashion.  A rigid 22 French cystoscope was passed in the urethra and the bladder.  Bladder was inspected free masses or lesions.  the ureteral orifices were in the normal orthotopic locations.  a 6 french ureteral catheter was then instilled into the left ureteral orifice.  a gentle retrograde was obtained and findings noted above.  we then placed a zip wire through the ureteral catheter and advanced up to the renal pelvis.  we then removed the cystoscope and cannulated the left ureteral orifice with a semirigid ureteroscope.  We encountered a stone in the proximal ureter. Using a 200nm laser fiber the stone was fragmented. Multiple fragments were removed with the NGage basket. The proximal aspect of the stone could not be reached due to the caliber of the  ureter and we elected to place a stent.  we placed a 6 x 26 double-j ureteral stent over the original zip wire.  We then removed the wire and good coil was noted in the the renal pelvis under fluoroscopy and the bladder under direct vision.   the bladder was then drained and this concluded the procedure which was well tolerated by patient.  Complications: None  Condition: Stable, extubated, transferred to PACU  Plan: Patient is to be discharged home as to follow-up in 2 weeks for repeat stone extraction.

## 2015-11-22 ENCOUNTER — Ambulatory Visit: Payer: Self-pay | Admitting: Urology

## 2015-11-22 LAB — STONE ANALYSIS
CA OXALATE, DIHYDRATE: 5 %
CA OXALATE, MONOHYDR.: 90 %
CA PHOS CRY STONE QL IR: 5 %
STONE WEIGHT KSTONE: 43 mg

## 2015-11-23 ENCOUNTER — Telehealth: Payer: Self-pay | Admitting: Urology

## 2015-11-23 NOTE — Telephone Encounter (Signed)
Pt advised of pre op date/time and sx date. Sx: 12/20/15 @ 12:30pm--Dr Baird KayMckenzie--. Pre op: 12/15/15 @ 9:00am. Post Op: 12/27/15 @ 11:15am with Dr Ronne BinningMcKenzie.  Patient is self pay.

## 2015-11-24 ENCOUNTER — Other Ambulatory Visit: Payer: Self-pay

## 2015-11-24 DIAGNOSIS — N211 Calculus in urethra: Secondary | ICD-10-CM

## 2015-11-29 ENCOUNTER — Ambulatory Visit: Payer: Self-pay | Admitting: Urology

## 2015-12-07 ENCOUNTER — Other Ambulatory Visit: Payer: Self-pay | Admitting: Urology

## 2015-12-07 ENCOUNTER — Encounter (HOSPITAL_COMMUNITY): Payer: Self-pay | Admitting: *Deleted

## 2015-12-12 ENCOUNTER — Encounter (HOSPITAL_COMMUNITY)
Admission: RE | Disposition: A | Discharge status: Home / Self Care | Payer: Self-pay | Source: Ambulatory Visit | Attending: Urology

## 2015-12-12 ENCOUNTER — Ambulatory Visit (HOSPITAL_COMMUNITY): Payer: Self-pay | Admitting: Anesthesiology

## 2015-12-12 ENCOUNTER — Ambulatory Visit (HOSPITAL_COMMUNITY)
Admission: RE | Admit: 2015-12-12 | Discharge: 2015-12-12 | Disposition: A | Discharge status: Home / Self Care | Payer: Self-pay | Source: Ambulatory Visit | Attending: Urology | Admitting: Urology

## 2015-12-12 ENCOUNTER — Encounter (HOSPITAL_COMMUNITY): Payer: Self-pay | Admitting: *Deleted

## 2015-12-12 DIAGNOSIS — F17210 Nicotine dependence, cigarettes, uncomplicated: Secondary | ICD-10-CM | POA: Insufficient documentation

## 2015-12-12 DIAGNOSIS — J449 Chronic obstructive pulmonary disease, unspecified: Secondary | ICD-10-CM | POA: Insufficient documentation

## 2015-12-12 DIAGNOSIS — F3130 Bipolar disorder, current episode depressed, mild or moderate severity, unspecified: Secondary | ICD-10-CM | POA: Insufficient documentation

## 2015-12-12 DIAGNOSIS — F209 Schizophrenia, unspecified: Secondary | ICD-10-CM | POA: Insufficient documentation

## 2015-12-12 DIAGNOSIS — Z79899 Other long term (current) drug therapy: Secondary | ICD-10-CM | POA: Insufficient documentation

## 2015-12-12 DIAGNOSIS — F419 Anxiety disorder, unspecified: Secondary | ICD-10-CM | POA: Insufficient documentation

## 2015-12-12 DIAGNOSIS — F319 Bipolar disorder, unspecified: Secondary | ICD-10-CM | POA: Insufficient documentation

## 2015-12-12 DIAGNOSIS — N202 Calculus of kidney with calculus of ureter: Secondary | ICD-10-CM | POA: Insufficient documentation

## 2015-12-12 HISTORY — DX: Anxiety disorder, unspecified: F41.9

## 2015-12-12 HISTORY — PX: HOLMIUM LASER APPLICATION: SHX5852

## 2015-12-12 HISTORY — PX: CYSTOSCOPY WITH URETEROSCOPY AND STENT PLACEMENT: SHX6377

## 2015-12-12 HISTORY — DX: Reserved for inherently not codable concepts without codable children: IMO0001

## 2015-12-12 LAB — BASIC METABOLIC PANEL
ANION GAP: 9 (ref 5–15)
BUN: 25 mg/dL — ABNORMAL HIGH (ref 6–20)
CO2: 22 mmol/L (ref 22–32)
Calcium: 9.4 mg/dL (ref 8.9–10.3)
Chloride: 106 mmol/L (ref 101–111)
Creatinine, Ser: 0.62 mg/dL (ref 0.61–1.24)
GFR calc Af Amer: >60 mL/min (ref >60)
GLUCOSE: 100 mg/dL — AB (ref 65–99)
POTASSIUM: 4.2 mmol/L (ref 3.5–5.1)
Sodium: 137 mmol/L (ref 135–145)

## 2015-12-12 LAB — HEMOGLOBIN: Hemoglobin: 13.5 g/dL (ref 13.0–17.0)

## 2015-12-12 SURGERY — CYSTOURETEROSCOPY, WITH STENT INSERTION
Anesthesia: General | Laterality: Left

## 2015-12-12 MED ORDER — SODIUM CHLORIDE 0.9 % IV SOLN: 250.0000 mL | INTRAVENOUS | Status: DC | PRN

## 2015-12-12 MED ORDER — BELLADONNA ALKALOIDS-OPIUM 16.2-60 MG RE SUPP
RECTAL | Status: AC
  Filled 2015-12-12: qty 1

## 2015-12-12 MED ORDER — PROPOFOL 10 MG/ML IV BOLUS
INTRAVENOUS | Status: AC
  Filled 2015-12-12: qty 20

## 2015-12-12 MED ORDER — LIDOCAINE HCL (CARDIAC) 20 MG/ML IV SOLN
INTRAVENOUS | Status: AC
  Filled 2015-12-12: qty 5

## 2015-12-12 MED ORDER — OXYCODONE HCL 5 MG PO TABS: 5.0000 mg | ORAL_TABLET | ORAL | Status: DC | PRN

## 2015-12-12 MED ORDER — ACETAMINOPHEN 650 MG RE SUPP
650.0000 mg | RECTAL | Status: DC | PRN
  Filled 2015-12-12: qty 1

## 2015-12-12 MED ORDER — CEFAZOLIN SODIUM-DEXTROSE 2-4 GM/100ML-% IV SOLN
INTRAVENOUS | Status: AC
  Filled 2015-12-12: qty 100

## 2015-12-12 MED ORDER — LACTATED RINGERS IV SOLN
INTRAVENOUS | Status: DC
  Administered 2015-12-12: 10:00:00 via INTRAVENOUS

## 2015-12-12 MED ORDER — FENTANYL CITRATE (PF) 100 MCG/2ML IJ SOLN: 25.0000 ug | INTRAMUSCULAR | Status: DC | PRN

## 2015-12-12 MED ORDER — FENTANYL CITRATE (PF) 100 MCG/2ML IJ SOLN
INTRAMUSCULAR | Status: AC
  Administered 2015-12-12: 50 ug via INTRAVENOUS
  Filled 2015-12-12: qty 2

## 2015-12-12 MED ORDER — ACETAMINOPHEN 325 MG PO TABS: 650.0000 mg | ORAL_TABLET | ORAL | Status: DC | PRN

## 2015-12-12 MED ORDER — DIATRIZOATE MEGLUMINE 30 % UR SOLN
URETHRAL | Status: AC
  Filled 2015-12-12: qty 100

## 2015-12-12 MED ORDER — SODIUM CHLORIDE 0.9 % IR SOLN
Status: DC | PRN
  Administered 2015-12-12: 3000 mL

## 2015-12-12 MED ORDER — OXYCODONE-ACETAMINOPHEN 5-325 MG PO TABS: 1.0000 | ORAL_TABLET | Freq: Four times a day (QID) | ORAL | Status: DC | PRN

## 2015-12-12 MED ORDER — CEFAZOLIN SODIUM-DEXTROSE 2-4 GM/100ML-% IV SOLN
2.0000 g | INTRAVENOUS | Status: AC
  Administered 2015-12-12: 2 g via INTRAVENOUS

## 2015-12-12 MED ORDER — FENTANYL CITRATE (PF) 250 MCG/5ML IJ SOLN
INTRAMUSCULAR | Status: AC
  Filled 2015-12-12: qty 5

## 2015-12-12 MED ORDER — ONDANSETRON HCL 4 MG/2ML IJ SOLN: 4.0000 mg | Freq: Once | INTRAMUSCULAR | Status: DC | PRN

## 2015-12-12 MED ORDER — ONDANSETRON HCL 4 MG/2ML IJ SOLN
INTRAMUSCULAR | Status: DC | PRN
  Administered 2015-12-12: 4 mg via INTRAVENOUS

## 2015-12-12 MED ORDER — SODIUM CHLORIDE 0.9% FLUSH: 3.0000 mL | Freq: Two times a day (BID) | INTRAVENOUS | Status: DC

## 2015-12-12 MED ORDER — 0.9 % SODIUM CHLORIDE (POUR BTL) OPTIME
TOPICAL | Status: DC | PRN
  Administered 2015-12-12: 1000 mL

## 2015-12-12 MED ORDER — CEFAZOLIN SODIUM-DEXTROSE 2-4 GM/100ML-% IV SOLN: 2.0000 g | INTRAVENOUS | Status: DC

## 2015-12-12 MED ORDER — MIDAZOLAM HCL 2 MG/2ML IJ SOLN
INTRAMUSCULAR | Status: AC
  Filled 2015-12-12: qty 2

## 2015-12-12 MED ORDER — LIDOCAINE HCL 1 % IJ SOLN
INTRAMUSCULAR | Status: DC | PRN
  Administered 2015-12-12: 80 mg via INTRADERMAL

## 2015-12-12 MED ORDER — MIDAZOLAM HCL 5 MG/5ML IJ SOLN
INTRAMUSCULAR | Status: DC | PRN
  Administered 2015-12-12: 2 mg via INTRAVENOUS

## 2015-12-12 MED ORDER — FENTANYL CITRATE (PF) 100 MCG/2ML IJ SOLN
25.0000 ug | INTRAMUSCULAR | Status: DC | PRN
  Administered 2015-12-12 (×2): 50 ug via INTRAVENOUS

## 2015-12-12 MED ORDER — BELLADONNA ALKALOIDS-OPIUM 16.2-60 MG RE SUPP
RECTAL | Status: DC | PRN
  Administered 2015-12-12: 1 via RECTAL

## 2015-12-12 MED ORDER — HYDROCODONE-ACETAMINOPHEN 7.5-325 MG PO TABS: 1.0000 | ORAL_TABLET | Freq: Once | ORAL | Status: DC | PRN

## 2015-12-12 MED ORDER — SODIUM CHLORIDE 0.9% FLUSH: 3.0000 mL | INTRAVENOUS | Status: DC | PRN

## 2015-12-12 MED ORDER — FENTANYL CITRATE (PF) 100 MCG/2ML IJ SOLN
INTRAMUSCULAR | Status: DC | PRN
  Administered 2015-12-12: 50 ug via INTRAVENOUS
  Administered 2015-12-12 (×2): 100 ug via INTRAVENOUS

## 2015-12-12 MED ORDER — PROPOFOL 10 MG/ML IV BOLUS
INTRAVENOUS | Status: DC | PRN
  Administered 2015-12-12: 150 mg via INTRAVENOUS
  Administered 2015-12-12: 50 mg via INTRAVENOUS

## 2015-12-12 SURGICAL SUPPLY — 25 items
BAG URO CATCHER STRL LF (MISCELLANEOUS) ×2 IMPLANT
BASKET LASER NITINOL 1.9FR (BASKET) IMPLANT
BASKET STONE NCOMPASS (UROLOGICAL SUPPLIES) IMPLANT
BSKT STON RTRVL 120 1.9FR (BASKET)
CATH URET 5FR 28IN OPEN ENDED (CATHETERS) ×1 IMPLANT
CATH URET DUAL LUMEN 6-10FR 50 (CATHETERS) ×1 IMPLANT
CLOTH BEACON ORANGE TIMEOUT ST (SAFETY) ×2 IMPLANT
DRSG TEGADERM 2-3/8X2-3/4 SM (GAUZE/BANDAGES/DRESSINGS) ×1 IMPLANT
EXTRACTOR STONE NITINOL NGAGE (UROLOGICAL SUPPLIES) ×1 IMPLANT
FIBER LASER FLEXIVA 1000 (UROLOGICAL SUPPLIES) IMPLANT
FIBER LASER FLEXIVA 365 (UROLOGICAL SUPPLIES) IMPLANT
FIBER LASER FLEXIVA 550 (UROLOGICAL SUPPLIES) IMPLANT
FIBER LASER TRAC TIP (UROLOGICAL SUPPLIES) ×1 IMPLANT
GLOVE SURG SS PI 8.0 STRL IVOR (GLOVE) ×1 IMPLANT
GOWN STRL REUS W/TWL XL LVL3 (GOWN DISPOSABLE) ×4 IMPLANT
GUIDEWIRE STR DUAL SENSOR (WIRE) ×2 IMPLANT
IV NS 1000ML (IV SOLUTION)
IV NS 1000ML BAXH (IV SOLUTION) ×1 IMPLANT
IV NS IRRIG 3000ML ARTHROMATIC (IV SOLUTION) ×1 IMPLANT
MANIFOLD NEPTUNE II (INSTRUMENTS) ×2 IMPLANT
PACK CYSTO (CUSTOM PROCEDURE TRAY) ×2 IMPLANT
SHEATH ACCESS URETERAL 38CM (SHEATH) ×2 IMPLANT
SHEATH URET ACCESS 10/12FR (MISCELLANEOUS) IMPLANT
STENT CONTOUR 6FRX26X.038 (STENTS) ×1 IMPLANT
TUBING CONNECTING 10 (TUBING) ×2 IMPLANT

## 2015-12-12 NOTE — Transfer of Care (Incomplete)
Immediate Anesthesia Transfer of Care Note  Patient: Luke Craig  Procedure(s) Performed: Procedure(s): LEFT URETEROSCOPY STONE EXTRACTION AND STENT PLACEMENT (Left) HOLMIUM LASER APPLICATION (Left)  Patient Location: {PLACES; ANE POST:19477::"PACU"}  Anesthesia Type:{PROCEDURES; ANE POST ANESTHESIA TYPE:19480}  Level of Consciousness: {FINDINGS; ANE POST LEVEL OF CONSCIOUSNESS:19484}  Airway & Oxygen Therapy: {Exam; oxygen device:30095}  Post-op Assessment: {ASSESSMENT;POST-OP ZOXWRU:04540}REPORT:18741}  Post vital signs: {DESC; ANE POST JWJXBJ:47829}VITALS:19483}  Last Vitals:  Filed Vitals:   12/12/15 0751  BP: 123/84  Pulse: 78  Temp: 36.9 C  Resp: 18    Last Pain:  Filed Vitals:   12/12/15 1130  PainSc: 3          Complications: {FINDINGS; ANE POST COMPLICATIONS:19485}

## 2015-12-12 NOTE — Brief Op Note (Signed)
12/12/2015  11:11 AM  PATIENT:  Everlean CherryHarold R Hanover  50 y.o. male  PRE-OPERATIVE DIAGNOSIS:  LEFT MID URETERAL STONE   POST-OPERATIVE DIAGNOSIS:  LEFT MID URETERAL STONE   PROCEDURE:  Procedure(s): LEFT URETEROSCOPY STONE EXTRACTION AND STENT PLACEMENT (Left) HOLMIUM LASER APPLICATION (Left) REMOVAL OF LEFT JJ STENT SURGEON:  Surgeon(s) and Role:    * Bjorn PippinJohn Jentri Aye, MD - Primary  PHYSICIAN ASSISTANT:   ASSISTANTS: none   ANESTHESIA:   general  EBL:     BLOOD ADMINISTERED:none  DRAINS: left JJ stent   LOCAL MEDICATIONS USED:  NONE  SPECIMEN:  Source of Specimen:  left ureteral stone  DISPOSITION OF SPECIMEN:  PATHOLOGY  COUNTS:  YES  TOURNIQUET:  * No tourniquets in log *  DICTATION: .Other Dictation: Dictation Number [id number]  PLAN OF CARE: Discharge to home after PACU  PATIENT DISPOSITION:  PACU - hemodynamically stable.   Delay start of Pharmacological VTE agent (>24hrs) due to surgical blood loss or risk of bleeding: not applicable

## 2015-12-12 NOTE — Discharge Instructions (Addendum)
CYSTOSCOPY HOME CARE INSTRUCTIONS  Activity: Rest for the remainder of the day.  Do not drive or operate equipment today.  You may resume normal activities in one to two days as instructed by your physician.   Meals: Drink plenty of liquids and eat light foods such as gelatin or soup this evening.  You may return to a normal meal plan tomorrow.  Return to Work: You may return to work in one to two days or as instructed by your physician.  Special Instructions / Symptoms: Call your physician if any of these symptoms occur:   -persistent or heavy bleeding  -bleeding which continues after first few urination  -large blood clots that are difficult to pass  -urine stream diminishes or stops completely  -fever equal to or higher than 101 degrees Farenheit.  -cloudy urine with a strong, foul odor  -severe pain  Females should always wipe from front to back after elimination.  You may feel some burning pain when you urinate.  This should disappear with time.  Applying moist heat to the lower abdomen or a hot tub bath may help relieve the pain. \  You may remove you stent by pulling the attached string on Friday morning.   If you don't feel comfortable pulling the stent you can come to the office Friday.     Patient Signature:  ________________________________________________________  Nurse's Signature:  ________________________________________________________      General Anesthesia, Adult, Care After Refer to this sheet in the next few weeks. These instructions provide you with information on caring for yourself after your procedure. Your health care provider may also give you more specific instructions. Your treatment has been planned according to current medical practices, but problems sometimes occur. Call your health care provider if you have any problems or questions after your procedure. WHAT TO EXPECT AFTER THE PROCEDURE After the procedure, it is typical to  experience:  Sleepiness.  Nausea and vomiting. HOME CARE INSTRUCTIONS  For the first 24 hours after general anesthesia:  Have a responsible person with you.  Do not drive a car. If you are alone, do not take public transportation.  Do not drink alcohol.  Do not take medicine that has not been prescribed by your health care provider.  Do not sign important papers or make important decisions.  You may resume a normal diet and activities as directed by your health care provider.  Change bandages (dressings) as directed.  If you have questions or problems that seem related to general anesthesia, call the hospital and ask for the anesthetist or anesthesiologist on call. SEEK MEDICAL CARE IF:  You have nausea and vomiting that continue the day after anesthesia.  You develop a rash. SEEK IMMEDIATE MEDICAL CARE IF:   You have difficulty breathing.  You have chest pain.  You have any allergic problems.   This information is not intended to replace advice given to you by your health care provider. Make sure you discuss any questions you have with your health care provider.   Document Released: 10/07/2000 Document Revised: 07/22/2014 Document Reviewed: 10/30/2011 Elsevier Interactive Patient Education [employer]2016 Elsevier Inc.

## 2015-12-12 NOTE — Transfer of Care (Signed)
Immediate Anesthesia Transfer of Care Note  Patient: Luke Craig  Procedure(s) Performed: Procedure(s): LEFT URETEROSCOPY STONE EXTRACTION AND STENT PLACEMENT (Left) HOLMIUM LASER APPLICATION (Left)  Patient Location: PACU  Anesthesia Type:General  Level of Consciousness: awake, alert  and oriented  Airway & Oxygen Therapy: Patient Spontanous Breathing and Patient connected to face mask oxygen  Post-op Assessment: Report given to RN and Post -op Vital signs reviewed and stable  Post vital signs: Reviewed and stable  Last Vitals:  Filed Vitals:   12/12/15 0751  BP: 123/84  Pulse: 78  Temp: 36.9 C  Resp: 18    Last Pain:  Filed Vitals:   12/12/15 0843  PainSc: 3          Complications: No apparent anesthesia complications

## 2015-12-12 NOTE — Anesthesia Procedure Notes (Signed)
Procedure Name: LMA Insertion Date/Time: 12/12/2015 10:27 AM Performed by: Donn PieriniUELLETTE, Jacquelyn Antony G Pre-anesthesia Checklist: Patient identified, Emergency Drugs available, Suction available and Patient being monitored Patient Re-evaluated:Patient Re-evaluated prior to inductionOxygen Delivery Method: Circle system utilized and Simple face mask Preoxygenation: Pre-oxygenation with 100% oxygen Intubation Type: IV induction LMA: LMA with gastric port inserted Number of attempts: 1 Placement Confirmation: positive ETCO2 and breath sounds checked- equal and bilateral

## 2015-12-12 NOTE — OR Nursing (Signed)
Stone taken per Dr. Wrenn 

## 2015-12-12 NOTE — Anesthesia Preprocedure Evaluation (Signed)
Anesthesia Evaluation  Patient identified by MRN, date of birth, ID band Patient awake    Reviewed: Allergy & Precautions, H&P , NPO status , Patient's Chart, lab work & pertinent test results  Airway Mallampati: I  TM Distance: >3 FB Neck ROM: Full    Dental  (+) Edentulous Upper, Edentulous Lower   Pulmonary COPD, Current Smoker,    Pulmonary exam normal        Cardiovascular negative cardio ROS Normal cardiovascular exam     Neuro/Psych PSYCHIATRIC DISORDERS ( Intermittent explosive disorder) Depression Bipolar Disorder Schizophrenia    GI/Hepatic negative GI ROS,   Endo/Other    Renal/GU      Musculoskeletal   Abdominal   Peds  Hematology   Anesthesia Other Findings   Reproductive/Obstetrics                             Anesthesia Physical  Anesthesia Plan  ASA: III  Anesthesia Plan: General   Post-op Pain Management:    Induction: Intravenous  Airway Management Planned: LMA  Additional Equipment:   Intra-op Plan:   Post-operative Plan: Extubation in OR  Informed Consent: I have reviewed the patients History and Physical, chart, labs and discussed the procedure including the risks, benefits and alternatives for the proposed anesthesia with the patient or authorized representative who has indicated his/her understanding and acceptance.     Plan Discussed with:   Anesthesia Plan Comments:         Anesthesia Quick Evaluation

## 2015-12-12 NOTE — Anesthesia Postprocedure Evaluation (Signed)
Anesthesia Post Note  Patient: Everlean CherryHarold R Karge  Procedure(s) Performed: Procedure(s) (LRB): LEFT URETEROSCOPY STONE EXTRACTION AND STENT PLACEMENT (Left) HOLMIUM LASER APPLICATION (Left)  Patient location during evaluation: PACU Anesthesia Type: General Level of consciousness: awake and alert Pain management: pain level controlled Vital Signs Assessment: post-procedure vital signs reviewed and stable Respiratory status: spontaneous breathing, nonlabored ventilation, respiratory function stable and patient connected to nasal cannula oxygen Cardiovascular status: blood pressure returned to baseline and stable Postop Assessment: no signs of nausea or vomiting Anesthetic complications: no    Last Vitals:  Filed Vitals:   12/12/15 1127 12/12/15 1130  BP: 137/89 130/87  Pulse: 72 77  Temp: 36.7 C   Resp: 16 19    Last Pain:  Filed Vitals:   12/12/15 1143  PainSc: 4                  Reino KentJudd, Gian Ybarra J

## 2015-12-12 NOTE — H&P (Signed)
  Chief Complaint left flank pain.   Active Problems Problems  1. Calculus of left ureter (N20.1) 2. Nephrolithiasis (N20.0)  History of Present Illness [name] is an [facility] patient of Dr. Ronne BinningMcKenzie with a 5x537mm left mid ureteral stone who had a stent and partial removal on 5/3.  He is out of pain med and has increased pain and hematuria.   Past Medical History Problems  1. History of Anxiety (F41.9) 2. History of Bipolar illness (F31.9) 3. History of depression (Z86.59)  Surgical History Problems  1. History of Foot Surgery 2. History of Shoulder Incision 3. History of Surgery Vas Deferens Vasectomy  Current Meds 1. BuSpar TABS;  Therapy: (Recorded:26Apr2017) to Recorded 2. CeleXA 40 MG Oral Tablet;  Therapy: (Recorded:26Apr2017) to Recorded 3. Depakote TBEC;  Therapy: (Recorded:26Apr2017) to Recorded 4. Xanax 0.5 MG Oral Tablet;  Therapy: (Recorded:26Apr2017) to Recorded  Allergies Medication  1. Aspirin TABS 2. Ibuprofen CAPS 3. tramadol  Family History Problems  1. Family history of Death of family member : Mother, Father 2. Family history of malignant neoplasm (Z80.9) : Mother, Father  Social History Problems  1. Denied: History of Alcohol use 2. Caffeine use (F15.90)   4 3. Current every day smoker (F17.200)   smokes 1 ppd for 35 years 4. Disabled 5. Married 6. Number of children   1 son; 2 daughters 7. Tobacco use (Z72.0)   1.5 packs for 30 years  Past medical and surgical history reviewed.   Review of Systems  Genitourinary: hematuria.  Gastrointestinal: nausea and flank pain.  Constitutional: night sweats, but no fever.    Vitals Vital Signs [Data Includes: Last 1 Day]  Recorded: 25May2017 08:47AM  Blood Pressure: 125 / 86 Temperature: 98 F Heart Rate: 63  Results/Data Urine [Data Includes: Last 1 Day]   25May2017  COLOR BROWN   APPEARANCE TURBID   SPECIFIC GRAVITY    pH TNP   GLUCOSE TNP   BILIRUBIN TNP   KETONE TNP    BLOOD TNP   PROTEIN TNP   NITRITE TNP   LEUKOCYTE ESTERASE TNP   SQUAMOUS EPITHELIAL/HPF 6-10 HPF  WBC 0-5 WBC/HPF  RBC >60 RBC/HPF  BACTERIA NONE SEEN HPF  CRYSTALS See Below HPF  CASTS NONE SEEN LPF  Yeast NONE SEEN HPF   The following images/tracing/specimen were independently visualized:  CT and OR films reviewed.  The following clinical lab reports were reviewed:  UA reviewed.    Assessment Assessed  1. Calculus of left ureter (N20.1)  He has increased pain and hematuria with a stent in place.   Plan Nephrolithiasis  1. Start: Oxycodone-Acetaminophen 10-325 MG Oral Tablet; TAKE 1 TABLET EVERY 4 TO 6  HOURS AS NEEDED FOR PAIN 2. Follow-up Schedule Surgery Office  Follow-up  Status: Complete  Done: 25May2017  I have refilled the percocet and will try to get him moved up on the schedule to next week for Dr. Ronne BinningMcKenzie or me if need be.   Discussion/Summary CC: Dr. Wilkie AyePatrick McKenzie.

## 2015-12-13 NOTE — Op Note (Signed)
NAMMaud Deed:  Nilsson, Cassady             ACCOUNT NO.:  [id number]  MEDICAL RECORD NO.:  [id number]  LOCATION:  [facility]                         FAC[facility]  PHYSICIAN:  Excell SeltzerJohn J. Annabell HowellsWrenn, M.D.    DATE OF BIRTH:  [birth year]  DATE OF PROCEDURE:  12/12/2015 DATE OF DISCHARGE:  12/12/2015                              OPERATIVE REPORT   PROCEDURES: 1. Cystoscopy with removal of left double-J stent. 2. Left ureteroscopic stone extraction with holmium laser lithotripsy     and insertion of left double-J stent.  PREOPERATIVE DIAGNOSIS:  Left mid ureteral stone with prior attempted ureteroscopy.  POSTOPERATIVE DIAGNOSIS: Left mid ureteral stone with prior attempted ureteroscopy with renal stones as well.  SURGEON:  Excell SeltzerJohn J. Annabell HowellsWrenn, MD  ANESTHESIA:  General.  SPECIMEN:  Stone fragments.  DRAINS:  A 6-French x 26 cm left double-J stent with string.  BLOOD LOSS:  Minimal.  COMPLICATIONS:  None.  INDICATIONS:  [name] is a 50 year old white male with a history of left mid ureteral stone.  He had previously undergone an attempted ureteroscopy with partial stone removal by Dr. Ronne BinningMcKenzie a couple weeks ago.  He is scheduled with Dr. Ronne BinningMcKenzie later in June but had increased pain and hematuria and it was felt that the biggest procedure up was appropriate.  FINDINGS OF PROCEDURE:  He was given Ancef.  He was taken to the operating room where general anesthetic was induced.  He was placed in lithotomy position.  His perineum and genitalia were prepped with Betadine solution and he was draped in usual sterile fashion.  Cystoscopy was performed using the 23-French scope and 30-degree lens. Examination revealed a normal urethra.  The external sphincter was intact.  The prostatic urethra was short without obstruction. Examination of bladder revealed some edema in the trigone from the stent.  It was a stent in the left ureteral orifice.  The right ureteral orifice was otherwise unremarkable.  No  other significant bladder abnormalities were noted.  The stent was grasped with a grasping forceps and pulled the urethral meatus and a guidewire was passed through the stent to the kidney.  The stent was removed and a 6.5 French double-lumen semi-rigid ureteroscope was then passed alongside the stent.  I was able to get it to approximately the mid ureter.  One stone was identified.  It was engaged with an engage basket and removed.  However, I could not get the scope over the iliacs.  At this point, a 38-cm digital access sheath was passed over the wire, initially the inner core, followed by the assembled sheath.  I was able to get this into the proximal ureter.  The wire and inner core were then removed and the double-lumen digital flexible was then passed through the scope.  A large stone was identified at the UPJ that appeared consistent with a mid ureteral stone that had been pushed proximally.  The stone was then engaged with a 200 micron TracTip Holmium laser fiber set on 1 watt and 10 hertz and broken into manageable fragments.  Those fragments were then removed with the engage.  Some additional solid small renal stones which had been noted on CT scan were then identified in  the mid and lower pole calices and removed with the engage basket. Once all significant stone fragments were removed, a guidewire was reinserted through the ureteroscope into the renal collecting system and the ureteroscope was backed out.  Under vision, no additional stone fragments were noted along the course of the ureter.  Once the scope and sheath were out the cystoscope was reinserted over the wire and a 6-French 26-cm double-J stent with tether was then inserted over the wire to the kidney.  The wire was removed leaving good coil in the kidney and good coil in the bladder.  The bladder was drained.  The cystoscope was removed.  The stent string was secured to the patient's penis, a B & O  suppository was placed.  The stone fragments were sent to pathology with some of them saved for the patient.  There were no complications.     Excell Seltzer. Annabell Howells, M.D.     JJW/MEDQ  D:  12/12/2015  T:  12/13/2015  Job:  [id number]

## 2015-12-15 ENCOUNTER — Other Ambulatory Visit (HOSPITAL_COMMUNITY): Payer: Self-pay

## 2015-12-20 ENCOUNTER — Encounter (HOSPITAL_COMMUNITY): Admission: RE | Payer: Self-pay | Source: Ambulatory Visit

## 2015-12-20 ENCOUNTER — Ambulatory Visit (HOSPITAL_COMMUNITY): Admission: RE | Admit: 2015-12-20 | Payer: Self-pay | Source: Ambulatory Visit | Admitting: Urology

## 2015-12-20 LAB — STONE ANALYSIS
CA OXALATE, DIHYDRATE: 10 %
CA OXALATE, MONOHYDR.: 80 %
Ca phos cry stone ql IR: 10 %
STONE WEIGHT KSTONE: 12.9 mg

## 2015-12-20 SURGERY — CYSTOSCOPY, WITH CALCULUS REMOVAL USING BASKET
Anesthesia: General | Laterality: Left

## 2015-12-27 ENCOUNTER — Ambulatory Visit (INDEPENDENT_AMBULATORY_CARE_PROVIDER_SITE_OTHER): Payer: Self-pay | Admitting: Urology

## 2015-12-27 ENCOUNTER — Ambulatory Visit (HOSPITAL_COMMUNITY)
Admission: RE | Admit: 2015-12-27 | Discharge: 2015-12-27 | Disposition: A | Discharge status: Home / Self Care | Payer: Self-pay | Source: Ambulatory Visit | Attending: Urology | Admitting: Urology

## 2015-12-27 ENCOUNTER — Other Ambulatory Visit: Payer: Self-pay | Admitting: Urology

## 2015-12-27 DIAGNOSIS — N200 Calculus of kidney: Secondary | ICD-10-CM

## 2015-12-27 DIAGNOSIS — R109 Unspecified abdominal pain: Secondary | ICD-10-CM

## 2015-12-27 DIAGNOSIS — N201 Calculus of ureter: Secondary | ICD-10-CM

## 2018-05-12 ENCOUNTER — Encounter (HOSPITAL_COMMUNITY): Payer: Self-pay | Admitting: Emergency Medicine

## 2018-05-12 ENCOUNTER — Emergency Department (HOSPITAL_COMMUNITY)
Admission: EM | Admit: 2018-05-12 | Discharge: 2018-05-12 | Disposition: A | Discharge status: Home / Self Care | Payer: Self-pay | Attending: Emergency Medicine | Admitting: Emergency Medicine

## 2018-05-12 ENCOUNTER — Emergency Department (HOSPITAL_COMMUNITY): Payer: Self-pay

## 2018-05-12 ENCOUNTER — Other Ambulatory Visit: Payer: Self-pay

## 2018-05-12 DIAGNOSIS — Z23 Encounter for immunization: Secondary | ICD-10-CM | POA: Insufficient documentation

## 2018-05-12 DIAGNOSIS — Y9389 Activity, other specified: Secondary | ICD-10-CM | POA: Insufficient documentation

## 2018-05-12 DIAGNOSIS — Z79899 Other long term (current) drug therapy: Secondary | ICD-10-CM | POA: Insufficient documentation

## 2018-05-12 DIAGNOSIS — W231XXA Caught, crushed, jammed, or pinched between stationary objects, initial encounter: Secondary | ICD-10-CM | POA: Insufficient documentation

## 2018-05-12 DIAGNOSIS — J449 Chronic obstructive pulmonary disease, unspecified: Secondary | ICD-10-CM | POA: Insufficient documentation

## 2018-05-12 DIAGNOSIS — F17210 Nicotine dependence, cigarettes, uncomplicated: Secondary | ICD-10-CM | POA: Insufficient documentation

## 2018-05-12 DIAGNOSIS — Y998 Other external cause status: Secondary | ICD-10-CM | POA: Insufficient documentation

## 2018-05-12 DIAGNOSIS — S60459A Superficial foreign body of unspecified finger, initial encounter: Secondary | ICD-10-CM | POA: Insufficient documentation

## 2018-05-12 DIAGNOSIS — S6710XA Crushing injury of unspecified finger(s), initial encounter: Secondary | ICD-10-CM | POA: Insufficient documentation

## 2018-05-12 DIAGNOSIS — Y929 Unspecified place or not applicable: Secondary | ICD-10-CM | POA: Insufficient documentation

## 2018-05-12 MED ORDER — ONDANSETRON HCL 4 MG PO TABS
4.0000 mg | ORAL_TABLET | Freq: Once | ORAL | Status: AC
  Administered 2018-05-12: 4 mg via ORAL
  Filled 2018-05-12: qty 1

## 2018-05-12 MED ORDER — DOXYCYCLINE HYCLATE 100 MG PO TABS
100.0000 mg | ORAL_TABLET | Freq: Once | ORAL | Status: AC
  Administered 2018-05-12: 100 mg via ORAL
  Filled 2018-05-12: qty 1

## 2018-05-12 MED ORDER — CLINDAMYCIN HCL 150 MG PO CAPS: 150.0000 mg | ORAL_CAPSULE | Freq: Four times a day (QID) | ORAL | 0 refills | Status: DC

## 2018-05-12 MED ORDER — HYDROCODONE-ACETAMINOPHEN 5-325 MG PO TABS: 1.0000 | ORAL_TABLET | ORAL | 0 refills | Status: DC | PRN

## 2018-05-12 MED ORDER — TETANUS-DIPHTH-ACELL PERTUSSIS 5-2.5-18.5 LF-MCG/0.5 IM SUSP
0.5000 mL | Freq: Once | INTRAMUSCULAR | Status: AC
  Administered 2018-05-12: 0.5 mL via INTRAMUSCULAR
  Filled 2018-05-12: qty 0.5

## 2018-05-12 MED ORDER — BACITRACIN-NEOMYCIN-POLYMYXIN 400-5-5000 EX OINT
TOPICAL_OINTMENT | Freq: Once | CUTANEOUS | Status: AC
  Administered 2018-05-12: 1 via TOPICAL
  Filled 2018-05-12: qty 1

## 2018-05-12 MED ORDER — HYDROCODONE-ACETAMINOPHEN 5-325 MG PO TABS
2.0000 | ORAL_TABLET | Freq: Once | ORAL | Status: AC
  Administered 2018-05-12: 2 via ORAL
  Filled 2018-05-12: qty 2

## 2018-05-12 NOTE — ED Triage Notes (Signed)
Patient states he smashed and cut his right index finger last night on metal. Complaining of pain and swelling to finger.

## 2018-05-12 NOTE — ED Provider Notes (Signed)
[facility] EMERGENCY DEPARTMENT Provider Note   CSN: [id number] Arrival date & time: 05/12/18  1554     History   Chief Complaint Chief Complaint  Patient presents with  . Finger Injury    HPI Luke Craig is a 52 y.o. male.  Patient is a 51 year old male who presents to the emergency department with a complaint of injury to the right index finger.  The patient states that on yesterday October 28 he had his index finger smashed between 2 pieces of metal.  He says he had mild pain on yesterday, but today he cannot move the finger.  He says he has had some oozing of blood from the sites during the night last night.  He cleansed it with peroxide a couple of times.  He says the pain is getting more and more intense, and he is concerned that he has severe pain with attempting to move the index finger.  He presents now for assistance with these issues.  Patient states his last tetanus shot was in 2012.  The history is provided by the patient.    Past Medical History:  Diagnosis Date  . Anxiety   . Bipolar 1 disorder (HCC)   . Bronchitis    hx  . COPD (chronic obstructive pulmonary disease) (HCC)   . Depression   . History of kidney stones   . History of kidney stones   . Intermittent explosive disorder   . Open wound of right middle finger without damage to nail   . Schizophrenia (HCC)   . Shortness of breath dyspnea    increased exertion     Patient Active Problem List   Diagnosis Date Noted  . Laceration of second finger, right 10/23/2011    Past Surgical History:  Procedure Laterality Date  . BUNIONECTOMY Bilateral   . CYSTOSCOPY W/ URETERAL STENT PLACEMENT Left 11/15/2015   Procedure: CYSTOSCOPY WITH RETROGRADE PYELOGRAM/URETERAL STENT PLACEMENT/URETEROSCOPY/STONE BASKET EXTRACTION;  Surgeon: Malen Gauze, MD;  Location: [facility];  Service: Urology;  Laterality: Left;  . CYSTOSCOPY WITH URETEROSCOPY AND STENT PLACEMENT Left 12/12/2015   Procedure: LEFT  URETEROSCOPY STONE EXTRACTION AND STENT PLACEMENT;  Surgeon: Bjorn Pippin, MD;  Location: [facility];  Service: Urology;  Laterality: Left;  . CYSTOSCOPY/URETEROSCOPY/HOLMIUM LASER Left 11/15/2015   Procedure: CYSTOSCOPY/STONE EXTRACTION WITH HOLMIUM LASER;  Surgeon: Malen Gauze, MD;  Location: [facility];  Service: Urology;  Laterality: Left;  . HOLMIUM LASER APPLICATION Left 12/12/2015   Procedure: HOLMIUM LASER APPLICATION;  Surgeon: Bjorn Pippin, MD;  Location: [facility];  Service: Urology;  Laterality: Left;  . I&D EXTREMITY  10/30/2011   Procedure: IRRIGATION AND DEBRIDEMENT EXTREMITY;  Surgeon: Sharma Covert, MD;  Location: [facility];  Service: Orthopedics;  Laterality: Right;  Wound exploration right middle finger with repair  . SHOULDER SURGERY Right    separated shoulder  . VASECTOMY          Home Medications    Prior to Admission medications   Medication Sig Start Date End Date Taking? Authorizing Provider  ALPRAZolam Prudy Feeler) 0.5 MG tablet Take 0.5 mg by mouth 4 (four) times daily as needed for anxiety.     [provider]  busPIRone (BUSPAR) 10 MG tablet Take 30 mg by mouth 2 (two) times daily.    [provider]  citalopram (CELEXA) 20 MG tablet Take 20 mg by mouth daily.    [provider]  divalproex (DEPAKOTE ER) 500 MG 24 hr tablet Take 1,500 mg by  mouth daily. At 5pm    [provider]  oxyCODONE-acetaminophen (PERCOCET/ROXICET) 5-325 MG tablet Take 1 tablet by mouth every 6 (six) hours as needed. 12/12/15   Bjorn Pippin, MD  promethazine (PHENERGAN) 12.5 MG tablet Take 1 tablet (12.5 mg total) by mouth every 6 (six) hours as needed for nausea or vomiting. 11/15/15   McKenzie, Mardene Celeste, MD  tamsulosin (FLOMAX) 0.4 MG CAPS capsule Take 1 capsule (0.4 mg total) by mouth at bedtime. 11/15/15   McKenzie, Mardene Celeste, MD    Family History History reviewed. No pertinent family history.  Social History Social History   Tobacco Use  . Smoking status: Current  Every Day Smoker    Packs/day: 1.00    Years: 35.00    Pack years: 35.00    Types: Cigarettes  . Smokeless tobacco: Never Used  Substance Use Topics  . Alcohol use: No  . Drug use: No     Allergies   Aspirin; Ibuprofen; and Tramadol   Review of Systems Review of Systems  Constitutional: Negative for activity change.       All ROS Neg except as noted in HPI  HENT: Negative for nosebleeds.   Eyes: Negative for photophobia and discharge.  Respiratory: Negative for cough, shortness of breath and wheezing.   Cardiovascular: Negative for chest pain and palpitations.  Gastrointestinal: Negative for abdominal pain and blood in stool.  Genitourinary: Negative for dysuria, frequency and hematuria.  Musculoskeletal: Positive for arthralgias. Negative for back pain and neck pain.       Finger pain  Skin: Negative.   Neurological: Negative for dizziness, seizures and speech difficulty.  Psychiatric/Behavioral: Negative for confusion and hallucinations.     Physical Exam Updated Vital Signs BP (!) 152/105 (BP Location: Right Arm)   Pulse 93   Temp 99 F (37.2 C) (Oral)   Resp 18   Ht 5\' 11"  (1.803 m)   Wt 81.6 kg   SpO2 97%   BMI 25.10 kg/m   Physical Exam  Constitutional: He is oriented to person, place, and time. He appears well-developed and well-nourished.  Non-toxic appearance.  HENT:  Head: Normocephalic.  Right Ear: Tympanic membrane and external ear normal.  Left Ear: Tympanic membrane and external ear normal.  Eyes: Pupils are equal, round, and reactive to light. EOM and lids are normal.  Neck: Normal range of motion. Neck supple. Carotid bruit is not present.  Cardiovascular: Normal rate, regular rhythm, normal heart sounds, intact distal pulses and normal pulses.  Pulmonary/Chest: Breath sounds normal. No respiratory distress.  Abdominal: Soft. Bowel sounds are normal. There is no tenderness. There is no guarding.  Musculoskeletal: Normal range of motion.    There is swelling of the distal portion of the right index finger.  There is a 2.2 cm laceration of the dorsum of the index finger near the DIP joint area.  There is a denuded area of the palmar surface of the right index finger just below the DIP joint area.  There is no active bleeding at this time.  There is pain to palpation at the base of the index finger just above the MP joint.  There is good range of motion at the MP joint area.  Capillary refill is less than 2 seconds.  Lymphadenopathy:       Head (right side): No submandibular adenopathy present.       Head (left side): No submandibular adenopathy present.    He has no cervical adenopathy.  Neurological: He is alert  and oriented to person, place, and time. He has normal strength. No cranial nerve deficit or sensory deficit.  Skin: Skin is warm and dry.  Psychiatric: He has a normal mood and affect. His speech is normal.  Nursing note and vitals reviewed.    ED Treatments / Results  Labs (all labs ordered are listed, but only abnormal results are displayed) Labs Reviewed - No data to display  EKG None  Radiology Dg Finger Index Right  Result Date: 05/12/2018 CLINICAL DATA:  Recent laceration with soft tissue swelling and pain, initial encounter EXAM: RIGHT INDEX FINGER 2+V COMPARISON:  None. FINDINGS: Soft tissue swelling is noted as well as mild laceration in the second digit consistent with the given clinical history. A tiny radiopaque foreign body is noted medially near the DIP joint. No acute bony abnormality is seen. IMPRESSION: Soft tissue swelling consistent with the recent injury. Small radiopaque foreign body. No definitive bony abnormality is seen. Electronically Signed   By: Alcide Clever M.D.   On: 05/12/2018 16:29    Procedures Procedures (including critical care time)  Medications Ordered in ED Medications - No data to display   Initial Impression / Assessment and Plan / ED Course  I have reviewed the triage  vital signs and the nursing notes.  Pertinent labs & imaging results that were available during my care of the patient were reviewed by me and considered in my medical decision making (see chart for details).       Final Clinical Impressions(s) / ED Diagnoses MDM  Blood pressure is elevated at 152/105, otherwise the vital signs are nonacute.  Pulse oximetry is 97% on room air.  Within normal limits by my interpretation.  The patient had the right index finger smashed between 2 pieces of metal on yesterday October 28.  He did not seek medical attention at that time.  He says he had some mild pain, but nothing significant.  He had oozing of blood however throughout the night from the wounds.  Today he could not move the finger, and he had a significant amount of swelling of the index finger.  X-ray shows soft tissue swelling, but no fracture, no dislocation.  Tetanus updated today.  Patient placed on antibiotics and pain medication.  I have asked the patient to cleanse the wound daily with soap and water and to change the dressing daily.  Patient fitted with a finger splint.  Patient is to follow-up primary physician or return to the emergency department if any changes in condition, problems, or concerns.   Final diagnoses:  Crushing injury of finger, initial encounter  Foreign body in skin of finger, initial encounter    ED Discharge Orders         Ordered    clindamycin (CLEOCIN) 150 MG capsule  Every 6 hours     05/12/18 2004    HYDROcodone-acetaminophen (NORCO/VICODIN) 5-325 MG tablet  Every 4 hours PRN     05/12/18 2004           Ivery Quale, PA-C 05/12/18 2008    Jacalyn Lefevre, MD 05/12/18 2014

## 2018-05-12 NOTE — Discharge Instructions (Addendum)
Your blood pressure is elevated.  Please have this rechecked soon.  The x-ray of your finger is negative for fracture or dislocation.  There is a tiny little sliver of metal under the skin.  Please cleanse the wound daily with soap and water.  Please use clindamycin with breakfast, lunch, dinner, and at bedtime until all taken.  Keep your hand elevated as much as possible.  Use the splint to protect it from additional injury.  Use Tylenol extra strength for mild pain, use Norco for more severe pain. This medication may cause drowsiness. Please do not drink, drive, or participate in activity that requires concentration while taking this medication.

## 2018-09-05 ENCOUNTER — Encounter (HOSPITAL_COMMUNITY): Payer: Self-pay | Admitting: Emergency Medicine

## 2018-09-05 ENCOUNTER — Other Ambulatory Visit: Payer: Self-pay

## 2018-09-05 ENCOUNTER — Emergency Department (HOSPITAL_COMMUNITY)
Admission: EM | Admit: 2018-09-05 | Discharge: 2018-09-05 | Disposition: A | Discharge status: Home / Self Care | Payer: Self-pay | Attending: Emergency Medicine | Admitting: Emergency Medicine

## 2018-09-05 DIAGNOSIS — F17210 Nicotine dependence, cigarettes, uncomplicated: Secondary | ICD-10-CM | POA: Insufficient documentation

## 2018-09-05 DIAGNOSIS — M5441 Lumbago with sciatica, right side: Secondary | ICD-10-CM | POA: Insufficient documentation

## 2018-09-05 DIAGNOSIS — J449 Chronic obstructive pulmonary disease, unspecified: Secondary | ICD-10-CM | POA: Insufficient documentation

## 2018-09-05 DIAGNOSIS — M5431 Sciatica, right side: Secondary | ICD-10-CM

## 2018-09-05 DIAGNOSIS — Z79899 Other long term (current) drug therapy: Secondary | ICD-10-CM | POA: Insufficient documentation

## 2018-09-05 MED ORDER — PREDNISONE 10 MG PO TABS: ORAL_TABLET | ORAL | 0 refills | Status: DC

## 2018-09-05 MED ORDER — CYCLOBENZAPRINE HCL 10 MG PO TABS: 10.0000 mg | ORAL_TABLET | Freq: Three times a day (TID) | ORAL | 0 refills | Status: DC | PRN

## 2018-09-05 MED ORDER — CYCLOBENZAPRINE HCL 10 MG PO TABS
10.0000 mg | ORAL_TABLET | Freq: Once | ORAL | Status: AC
  Administered 2018-09-05: 10 mg via ORAL
  Filled 2018-09-05: qty 1

## 2018-09-05 MED ORDER — HYDROCODONE-ACETAMINOPHEN 5-325 MG PO TABS: ORAL_TABLET | ORAL | 0 refills | Status: DC

## 2018-09-05 MED ORDER — HYDROCODONE-ACETAMINOPHEN 5-325 MG PO TABS
1.0000 | ORAL_TABLET | Freq: Once | ORAL | Status: AC
  Administered 2018-09-05: 1 via ORAL
  Filled 2018-09-05: qty 1

## 2018-09-05 NOTE — ED Notes (Signed)
Pt c/o lower back pain that radiates to right thigh area at times that started after placing ice out due to snow a few days ago, pain is worse with movement and certain positions, denies any previous injury,

## 2018-09-05 NOTE — ED Triage Notes (Signed)
Pt c/o lower back pain since putting down salt on Thursday. Denies hx of back problems, incontinence, or numbness in BLE.

## 2018-09-05 NOTE — ED Provider Notes (Signed)
[facility] EMERGENCY DEPARTMENT Provider Note   CSN: [id number] Arrival date & time: 09/05/18  2137    History   Chief Complaint Chief Complaint  Patient presents with  . Back Pain    HPI Luke Craig is a 53 y.o. male.     HPI   Luke Craig is a 53 y.o. male who presents to the Emergency Department complaining of right-sided low back pain that began 2 days ago.  He states his pain began after picking up bags of road salt.  He states he felt a sharp stabbing type pain to his right lower back that radiates into his hip and buttocks and stops at the level of his lateral thigh.  Worse with movement and improves at rest.  He is taken Tylenol for his symptoms without relief.  He denies history of chronic back pain.  He also denies fever, chills, abdominal pain, urine or bowel changes, and numbness or weakness of his lower extremities.  Past Medical History:  Diagnosis Date  . Anxiety   . Bipolar 1 disorder (HCC)   . Bronchitis    hx  . COPD (chronic obstructive pulmonary disease) (HCC)   . Depression   . History of kidney stones   . History of kidney stones   . Intermittent explosive disorder   . Open wound of right middle finger without damage to nail   . Schizophrenia (HCC)   . Shortness of breath dyspnea    increased exertion     Patient Active Problem List   Diagnosis Date Noted  . Laceration of second finger, right 10/23/2011    Past Surgical History:  Procedure Laterality Date  . BUNIONECTOMY Bilateral   . CYSTOSCOPY W/ URETERAL STENT PLACEMENT Left 11/15/2015   Procedure: CYSTOSCOPY WITH RETROGRADE PYELOGRAM/URETERAL STENT PLACEMENT/URETEROSCOPY/STONE BASKET EXTRACTION;  Surgeon: Malen Gauze, MD;  Location: [facility];  Service: Urology;  Laterality: Left;  . CYSTOSCOPY WITH URETEROSCOPY AND STENT PLACEMENT Left 12/12/2015   Procedure: LEFT URETEROSCOPY STONE EXTRACTION AND STENT PLACEMENT;  Surgeon: Bjorn Pippin, MD;  Location: [facility];  Service:  Urology;  Laterality: Left;  . CYSTOSCOPY/URETEROSCOPY/HOLMIUM LASER Left 11/15/2015   Procedure: CYSTOSCOPY/STONE EXTRACTION WITH HOLMIUM LASER;  Surgeon: Malen Gauze, MD;  Location: [facility];  Service: Urology;  Laterality: Left;  . HOLMIUM LASER APPLICATION Left 12/12/2015   Procedure: HOLMIUM LASER APPLICATION;  Surgeon: Bjorn Pippin, MD;  Location: [facility];  Service: Urology;  Laterality: Left;  . I&D EXTREMITY  10/30/2011   Procedure: IRRIGATION AND DEBRIDEMENT EXTREMITY;  Surgeon: Sharma Covert, MD;  Location: [facility];  Service: Orthopedics;  Laterality: Right;  Wound exploration right middle finger with repair  . SHOULDER SURGERY Right    separated shoulder  . VASECTOMY          Home Medications    Prior to Admission medications   Medication Sig Start Date End Date Taking? Authorizing Provider  ALPRAZolam Prudy Feeler) 0.5 MG tablet Take 0.5 mg by mouth 4 (four) times daily as needed for anxiety.     [provider]  busPIRone (BUSPAR) 10 MG tablet Take 30 mg by mouth 2 (two) times daily.    [provider]  citalopram (CELEXA) 20 MG tablet Take 20 mg by mouth daily.    [provider]  clindamycin (CLEOCIN) 150 MG capsule Take 1 capsule (150 mg total) by mouth every 6 (six) hours. 05/12/18   Ivery Quale, PA-C  cyclobenzaprine (FLEXERIL) 10 MG tablet Take 1 tablet (10  mg total) by mouth 3 (three) times daily as needed. 09/05/18   Lafaye Mcelmurry, PA-C  divalproex (DEPAKOTE ER) 500 MG 24 hr tablet Take 1,500 mg by mouth daily. At 5pm    [provider]  HYDROcodone-acetaminophen (NORCO/VICODIN) 5-325 MG tablet Take one tab po q 4 hrs prn pain 09/05/18   Nyle Limb, PA-C  predniSONE (DELTASONE) 10 MG tablet Take 6 tablets day one, 5 tablets day two, 4 tablets day three, 3 tablets day four, 2 tablets day five, then 1 tablet day six 09/05/18   Georgie Haque, PA-C  promethazine (PHENERGAN) 12.5 MG tablet Take 1 tablet (12.5 mg total) by mouth every 6  (six) hours as needed for nausea or vomiting. 11/15/15   McKenzie, Mardene Celeste, MD  tamsulosin (FLOMAX) 0.4 MG CAPS capsule Take 1 capsule (0.4 mg total) by mouth at bedtime. 11/15/15   McKenzie, Mardene Celeste, MD    Family History History reviewed. No pertinent family history.  Social History Social History   Tobacco Use  . Smoking status: Current Every Day Smoker    Packs/day: 1.00    Years: 35.00    Pack years: 35.00    Types: Cigarettes  . Smokeless tobacco: Never Used  Substance Use Topics  . Alcohol use: No  . Drug use: No     Allergies   Aspirin; Ibuprofen; and Tramadol   Review of Systems Review of Systems  Constitutional: Negative for fever.  Respiratory: Negative for shortness of breath.   Gastrointestinal: Negative for abdominal pain, constipation and vomiting.  Genitourinary: Negative for decreased urine volume, difficulty urinating, dysuria, flank pain and hematuria.  Musculoskeletal: Positive for back pain. Negative for joint swelling.  Skin: Negative for rash.  Neurological: Negative for weakness and numbness.     Physical Exam Updated Vital Signs BP 130/81 (BP Location: Right Arm)   Pulse 85   Temp 98 F (36.7 C) (Temporal)   Resp 14   Ht 5\' 11"  (1.803 m)   Wt 74.8 kg   SpO2 98%   BMI 23.01 kg/m   Physical Exam Vitals signs and nursing note reviewed.  Constitutional:      General: He is not in acute distress.    Appearance: He is well-developed.  HENT:     Head: Normocephalic and atraumatic.  Neck:     Musculoskeletal: Normal range of motion and neck supple.  Cardiovascular:     Rate and Rhythm: Normal rate and regular rhythm.     Pulses: Normal pulses.     Comments: DP pulses are strong and palpable bilaterally Pulmonary:     Effort: Pulmonary effort is normal. No respiratory distress.     Breath sounds: Normal breath sounds.  Abdominal:     General: There is no distension.     Palpations: Abdomen is soft.     Tenderness: There is no  abdominal tenderness.  Musculoskeletal:        General: Tenderness present. No swelling.     Lumbar back: He exhibits tenderness and pain. He exhibits normal range of motion, no swelling, no deformity, no laceration and normal pulse.     Right lower leg: No edema.     Left lower leg: No edema.     Comments: ttp of the lower right lumbar paraspinal muscles and over the right SI joint space.  No spinal tenderness.  Pt has 5/5 strength against resistance of bilateral lower extremities.  Hip flexors and extensors are intact   Skin:    General: Skin  is warm.     Capillary Refill: Capillary refill takes less than 2 seconds.     Findings: No rash.  Neurological:     General: No focal deficit present.     Mental Status: He is alert.     Sensory: No sensory deficit.     Motor: No abnormal muscle tone.     Coordination: Coordination normal.     Gait: Gait normal.     Deep Tendon Reflexes:     Reflex Scores:      Patellar reflexes are 2+ on the right side and 2+ on the left side.      Achilles reflexes are 2+ on the right side and 2+ on the left side.     ED Treatments / Results  Labs (all labs ordered are listed, but only abnormal results are displayed) Labs Reviewed - No data to display  EKG None  Radiology No results found.  Procedures Procedures (including critical care time)  Medications Ordered in ED Medications  HYDROcodone-acetaminophen (NORCO/VICODIN) 5-325 MG per tablet 1 tablet (has no administration in time range)  cyclobenzaprine (FLEXERIL) tablet 10 mg (has no administration in time range)     Initial Impression / Assessment and Plan / ED Course  I have reviewed the triage vital signs and the nursing notes.  Pertinent labs & imaging results that were available during my care of the patient were reviewed by me and considered in my medical decision making (see chart for details).        Pt with right sided sciatica.  He ambulates with a steady gait. No focal  neuro deficits, no concerning sx's for infectious process or cauda equina.  No history of fall or trauma to indicate need for imaging.  He appears appropriate for discharge home, and close PCP follow-up.  Return precautions were discussed.  Final Clinical Impressions(s) / ED Diagnoses   Final diagnoses:  Sciatica of right side    ED Discharge Orders         Ordered    cyclobenzaprine (FLEXERIL) 10 MG tablet  3 times daily PRN     09/05/18 2340    predniSONE (DELTASONE) 10 MG tablet     09/05/18 2340    HYDROcodone-acetaminophen (NORCO/VICODIN) 5-325 MG tablet     09/05/18 2340           Pauline Aus, PA-C 09/05/18 2353    Donnetta Hutching, MD 09/06/18 1932

## 2018-09-05 NOTE — Discharge Instructions (Addendum)
Alternate ice and heat to your lower back.  Avoid bending, reaching, or twisting movements for at least 1 week.  Follow-up with your primary doctor for recheck.

## 2021-03-06 ENCOUNTER — Other Ambulatory Visit: Payer: Self-pay

## 2021-03-06 ENCOUNTER — Emergency Department (HOSPITAL_COMMUNITY)
Admission: EM | Admit: 2021-03-06 | Discharge: 2021-03-06 | Disposition: A | Discharge status: Home / Self Care | Payer: Self-pay | Attending: Emergency Medicine | Admitting: Emergency Medicine

## 2021-03-06 ENCOUNTER — Encounter (HOSPITAL_COMMUNITY): Payer: Self-pay

## 2021-03-06 ENCOUNTER — Emergency Department (HOSPITAL_COMMUNITY): Payer: Self-pay

## 2021-03-06 DIAGNOSIS — J449 Chronic obstructive pulmonary disease, unspecified: Secondary | ICD-10-CM | POA: Insufficient documentation

## 2021-03-06 DIAGNOSIS — S61217A Laceration without foreign body of left little finger without damage to nail, initial encounter: Secondary | ICD-10-CM | POA: Insufficient documentation

## 2021-03-06 DIAGNOSIS — W293XXA Contact with powered garden and outdoor hand tools and machinery, initial encounter: Secondary | ICD-10-CM | POA: Insufficient documentation

## 2021-03-06 DIAGNOSIS — F17210 Nicotine dependence, cigarettes, uncomplicated: Secondary | ICD-10-CM | POA: Insufficient documentation

## 2021-03-06 MED ORDER — FENTANYL CITRATE PF 50 MCG/ML IJ SOSY
50.0000 ug | PREFILLED_SYRINGE | Freq: Once | INTRAMUSCULAR | Status: AC
  Administered 2021-03-06: 50 ug via INTRAMUSCULAR
  Filled 2021-03-06: qty 1

## 2021-03-06 MED ORDER — CEPHALEXIN 500 MG PO CAPS: 500.0000 mg | ORAL_CAPSULE | Freq: Two times a day (BID) | ORAL | 0 refills | Status: AC

## 2021-03-06 MED ORDER — OXYCODONE-ACETAMINOPHEN 5-325 MG PO TABS: 1.0000 | ORAL_TABLET | Freq: Three times a day (TID) | ORAL | 0 refills | Status: AC | PRN

## 2021-03-06 MED ORDER — LIDOCAINE HCL (PF) 1 % IJ SOLN
5.0000 mL | Freq: Once | INTRAMUSCULAR | Status: AC
  Administered 2021-03-06: 5 mL
  Filled 2021-03-06: qty 30

## 2021-03-06 NOTE — Discharge Instructions (Addendum)
You received 7 sutures in your finger, please refrain from getting wet for the first 24 hours.  After that I like you to rinse off the wound and change out the dressings 2 times daily for the next 7 to 10 days.  starting on antibiotics please take as prescribed. I have given you a short course of narcotics please take as prescribed.  This medication can make you drowsy do not consume alcohol or operate heavy machinery when taking this medication.  This medication is Tylenol in it do not take Tylenol and take this medication.    You must come back to this department, urgent care, your primary care provider neck 7 to 10 days have your sutures removed.  Come back to the emergency department if you develop chest pain, shortness of breath, severe abdominal pain, uncontrolled nausea, vomiting, diarrhea.

## 2021-03-06 NOTE — ED Triage Notes (Signed)
Pt states that cut left finger today on hedge trimmer. Pt says he bleed for couple hours. Finger wrapped at this time and bleeding is controlled.   Last tetanus shot was < 5 years ago.

## 2021-03-06 NOTE — ED Provider Notes (Signed)
[facility] EMERGENCY DEPARTMENT Provider Note   CSN: [id number] Arrival date & time: 03/06/21  1909     History No chief complaint on file.   Luke Craig is a 55 y.o. male.  HPI  Patient with no significant medical history presents to the emergency department with chief complaint of a laceration on his left pinky finger.  Patient states today while he was using a hedge trimmer and accidentally cut the lateral aspect of his right pinky finger.  States he was able to control the bleeding with direct pressure.  He denies paresthesia or weakness in that finger, is able to move in all joints, he is not immunocompromise, states he last had a tetanus shot 2 years ago.  He has no other complaints at this time.  Does not endorse fevers, chills, chest pain, short of breath, abdominal pain  Past Medical History:  Diagnosis Date   Anxiety    Bipolar 1 disorder (HCC)    Bronchitis    hx   COPD (chronic obstructive pulmonary disease) (HCC)    Depression    History of kidney stones    History of kidney stones    Intermittent explosive disorder    Open wound of right middle finger without damage to nail    Schizophrenia (HCC)    Shortness of breath dyspnea    increased exertion     Patient Active Problem List   Diagnosis Date Noted   Laceration of second finger, right 10/23/2011    Past Surgical History:  Procedure Laterality Date   BUNIONECTOMY Bilateral    CYSTOSCOPY W/ URETERAL STENT PLACEMENT Left 11/15/2015   Procedure: CYSTOSCOPY WITH RETROGRADE PYELOGRAM/URETERAL STENT PLACEMENT/URETEROSCOPY/STONE BASKET EXTRACTION;  Surgeon: Malen Gauze, MD;  Location: [facility];  Service: Urology;  Laterality: Left;   CYSTOSCOPY WITH URETEROSCOPY AND STENT PLACEMENT Left 12/12/2015   Procedure: LEFT URETEROSCOPY STONE EXTRACTION AND STENT PLACEMENT;  Surgeon: Bjorn Pippin, MD;  Location: [facility];  Service: Urology;  Laterality: Left;   CYSTOSCOPY/URETEROSCOPY/HOLMIUM LASER Left 11/15/2015    Procedure: CYSTOSCOPY/STONE EXTRACTION WITH HOLMIUM LASER;  Surgeon: Malen Gauze, MD;  Location: [facility];  Service: Urology;  Laterality: Left;   HOLMIUM LASER APPLICATION Left 12/12/2015   Procedure: HOLMIUM LASER APPLICATION;  Surgeon: Bjorn Pippin, MD;  Location: [facility];  Service: Urology;  Laterality: Left;   I & D EXTREMITY  10/30/2011   Procedure: IRRIGATION AND DEBRIDEMENT EXTREMITY;  Surgeon: Sharma Covert, MD;  Location: [facility];  Service: Orthopedics;  Laterality: Right;  Wound exploration right middle finger with repair   SHOULDER SURGERY Right    separated shoulder   VASECTOMY         No family history on file.  Social History   Tobacco Use   Smoking status: Every Day    Packs/day: 1.00    Years: 35.00    Pack years: 35.00    Types: Cigarettes   Smokeless tobacco: Never  Substance Use Topics   Alcohol use: No   Drug use: No    Home Medications Prior to Admission medications   Medication Sig Start Date End Date Taking? Authorizing Provider  cephALEXin (KEFLEX) 500 MG capsule Take 1 capsule (500 mg total) by mouth 2 (two) times daily for 7 days. 03/06/21 03/13/21 Yes Carroll Sage, PA-C  oxyCODONE-acetaminophen (PERCOCET/ROXICET) 5-325 MG tablet Take 1 tablet by mouth every 8 (eight) hours as needed for up to 2 days for severe pain. 03/06/21 03/08/21 Yes Carroll Sage, PA-C  ALPRAZolam (XANAX) 0.5 MG tablet Take 0.5 mg by mouth 4 (four) times daily as needed for anxiety.     [provider]  busPIRone (BUSPAR) 10 MG tablet Take 30 mg by mouth 2 (two) times daily.    [provider]  citalopram (CELEXA) 20 MG tablet Take 20 mg by mouth daily.    [provider]  clindamycin (CLEOCIN) 150 MG capsule Take 1 capsule (150 mg total) by mouth every 6 (six) hours. 05/12/18   Ivery Quale, PA-C  cyclobenzaprine (FLEXERIL) 10 MG tablet Take 1 tablet (10 mg total) by mouth 3 (three) times daily as needed. 09/05/18   Triplett, Tammy, PA-C   divalproex (DEPAKOTE ER) 500 MG 24 hr tablet Take 1,500 mg by mouth daily. At 5pm    [provider]  HYDROcodone-acetaminophen (NORCO/VICODIN) 5-325 MG tablet Take one tab po q 4 hrs prn pain 09/05/18   Triplett, Tammy, PA-C  predniSONE (DELTASONE) 10 MG tablet Take 6 tablets day one, 5 tablets day two, 4 tablets day three, 3 tablets day four, 2 tablets day five, then 1 tablet day six 09/05/18   Triplett, Tammy, PA-C  promethazine (PHENERGAN) 12.5 MG tablet Take 1 tablet (12.5 mg total) by mouth every 6 (six) hours as needed for nausea or vomiting. 11/15/15   McKenzie, Mardene Celeste, MD  tamsulosin (FLOMAX) 0.4 MG CAPS capsule Take 1 capsule (0.4 mg total) by mouth at bedtime. 11/15/15   McKenzie, Mardene Celeste, MD    Allergies    Aspirin, Ibuprofen, and Tramadol  Review of Systems   Review of Systems  Constitutional:  Negative for chills and fever.  HENT:  Negative for congestion.   Respiratory:  Negative for shortness of breath.   Cardiovascular:  Negative for chest pain.  Gastrointestinal:  Negative for abdominal pain.  Genitourinary:  Negative for enuresis.  Musculoskeletal:  Negative for back pain.  Skin:  Positive for wound. Negative for rash.       Left pinky finger.   Neurological:  Negative for dizziness.  Hematological:  Does not bruise/bleed easily.   Physical Exam Updated Vital Signs BP (!) 136/95   Pulse 77   Temp 98.3 F (36.8 C) (Oral)   Resp 20   Ht 5\' 11"  (1.803 m)   Wt 78 kg   SpO2 98%   BMI 23.99 kg/m   Physical Exam Vitals and nursing note reviewed.  Constitutional:      General: He is not in acute distress.    Appearance: He is not ill-appearing.  HENT:     Head: Normocephalic and atraumatic.     Nose: No congestion.  Eyes:     Conjunctiva/sclera: Conjunctivae normal.  Cardiovascular:     Rate and Rhythm: Normal rate and regular rhythm.     Pulses: Normal pulses.  Pulmonary:     Effort: Pulmonary effort is normal.  Musculoskeletal:      Comments: Patient's left pinky finger was visualized she has a noted 2 parallel jagged lacerations on the lateral aspect of the left pinky finger distally to the PIP joint.  Hemodynamically stable, no noted ligament or tendon damage present, no foreign body present.  He has full range of motion of all joints of his pinky, neurovascularly intact.  Skin:    General: Skin is warm and dry.  Neurological:     Mental Status: He is alert.  Psychiatric:        Mood and Affect: Mood normal.  ED Results / Procedures / Treatments   Labs (all labs ordered are listed, but only abnormal results are displayed) Labs Reviewed - No data to display  EKG None  Radiology DG Finger Little Left  Result Date: 03/06/2021 CLINICAL DATA:  Laceration EXAM: LEFT LITTLE FINGER 2+V COMPARISON:  None. FINDINGS: Soft tissue laceration noted in the left little finger overlying the PIP joint. No radiopaque foreign body. No acute bony abnormality. Specifically, no fracture, subluxation, or dislocation. IMPRESSION: No fracture or foreign body. Electronically Signed   By: Charlett Nose M.D.   On: 03/06/2021 20:07    Procedures .Marland KitchenLaceration Repair  Date/Time: 03/06/2021 10:27 PM Performed by: Carroll Sage, PA-C Authorized by: Carroll Sage, PA-C   Consent:    Consent obtained:  Verbal   Consent given by:  Patient   Risks discussed:  Infection, pain, retained foreign body, need for additional repair, poor cosmetic result, tendon damage, vascular damage, poor wound healing and nerve damage   Alternatives discussed:  No treatment Universal protocol:    Patient identity confirmed:  Verbally with patient Anesthesia:    Anesthesia method:  Local infiltration   Local anesthetic:  Lidocaine 1% w/o epi Laceration details:    Location:  Finger   Finger location:  L small finger   Length (cm):  4   Depth (mm):  2 Pre-procedure details:    Preparation:  Patient was prepped and draped in usual  sterile fashion and imaging obtained to evaluate for foreign bodies Exploration:    Limited defect created (wound extended): no     Hemostasis achieved with:  Direct pressure   Imaging obtained: x-ray     Imaging outcome: foreign body not noted     Wound exploration: wound explored through full range of motion and entire depth of wound visualized     Contaminated: no   Treatment:    Area cleansed with:  Betadine   Amount of cleaning:  Standard   Irrigation solution:  Sterile water   Irrigation method:  Syringe   Visualized foreign bodies/material removed: no     Debridement:  None Skin repair:    Repair method:  Sutures   Suture size:  4-0   Suture material:  Prolene   Number of sutures:  7 Approximation:    Approximation:  Close Repair type:    Repair type:  Simple Post-procedure details:    Dressing:  Non-adherent dressing   Procedure completion:  Tolerated well, no immediate complications Comments:     After the procedure patient motor, sensation, strength were all intact.area was soft to the touch with good capillary refill.  No signs of infection were noted, no rash, no ligament or tendon damage present.   Medications Ordered in ED Medications  fentaNYL (SUBLIMAZE) injection 50 mcg (50 mcg Intramuscular Given 03/06/21 2059)  lidocaine (PF) (XYLOCAINE) 1 % injection 5 mL (5 mLs Infiltration Given 03/06/21 2144)    ED Course  I have reviewed the triage vital signs and the nursing notes.  Pertinent labs & imaging results that were available during my care of the patient were reviewed by me and considered in my medical decision making (see chart for details).    MDM Rules/Calculators/A&P                          Initial impression-patient presents to the emerged part chief complaint of a laceration on his left pinky finger.  He is alert, does not appear in  distress, vital signs reassuring.  Triage obtained imaging will recommend suturing for improved wound  healing.  Work-up-imaging is negative for acute findings.  Reassessment- Will recommend suturing to decrease infection risk and to assist with the healing process.  Patient was agreeable with this and tolerated the procedure well.  He received 7 sutures.  Neurovascular was fully intact after the procedure.  Rule out-Low suspicion for fracture or dislocation as x-ray does not reveal any acute findings. low suspicion for ligament or tendon damage as area was palpated no gross defects noted, he had full range of motion in his left pinky finger.  Low suspicion for compartment syndrome as area was palpated it was soft to the touch, neurovascular fully intact before and after the procedure  Plan-  Laceration-patient receive 7 sutures will recommend basic wound care, provide with pain medications, antibiotics, follow-up next 7 to 9 days for suture removal.  Vital signs have remained stable, no indication for hospital admission.  Patient discussed with attending and they agreed with assessment and plan.  Patient given at home care as well strict return precautions.  Patient verbalized that they understood agreed to said plan.  Final Clinical Impression(s) / ED Diagnoses Final diagnoses:  Laceration of left little finger without foreign body without damage to nail, initial encounter    Rx / DC Orders ED Discharge Orders          Ordered    cephALEXin (KEFLEX) 500 MG capsule  2 times daily        03/06/21 2230    oxyCODONE-acetaminophen (PERCOCET/ROXICET) 5-325 MG tablet  Every 8 hours PRN        03/06/21 2231             Carroll SageFaulkner, Channon Brougher J, PA-C 03/06/21 2233    Franne FortsGray, Alicia P, DO 03/07/21 2326

## 2021-03-07 MED FILL — Oxycodone w/ Acetaminophen Tab 5-325 MG: ORAL | Qty: 6 | Status: AC

## 2021-12-26 ENCOUNTER — Encounter: Payer: Self-pay | Admitting: Family Medicine

## 2021-12-26 ENCOUNTER — Ambulatory Visit (INDEPENDENT_AMBULATORY_CARE_PROVIDER_SITE_OTHER): Payer: Self-pay | Admitting: Family Medicine

## 2021-12-26 VITALS — BP 129/79 | HR 60 | Temp 98.4°F | Ht 71.0 in | Wt 155.0 lb

## 2021-12-26 DIAGNOSIS — F1911 Other psychoactive substance abuse, in remission: Secondary | ICD-10-CM | POA: Insufficient documentation

## 2021-12-26 DIAGNOSIS — F319 Bipolar disorder, unspecified: Secondary | ICD-10-CM | POA: Insufficient documentation

## 2021-12-26 DIAGNOSIS — F209 Schizophrenia, unspecified: Secondary | ICD-10-CM | POA: Insufficient documentation

## 2021-12-26 DIAGNOSIS — N289 Disorder of kidney and ureter, unspecified: Secondary | ICD-10-CM

## 2021-12-26 DIAGNOSIS — F1120 Opioid dependence, uncomplicated: Secondary | ICD-10-CM

## 2021-12-26 DIAGNOSIS — Z7689 Persons encountering health services in other specified circumstances: Secondary | ICD-10-CM

## 2021-12-26 NOTE — Progress Notes (Signed)
Subjective:  Patient ID: Luke Craig, male    DOB: 12/07/1965, 56 y.o.   MRN: [id number]  Patient Care Team: Baruch Gouty, FNP as PCP - General (Family Medicine)   Chief Complaint:  New Patient (Initial Visit) (Kidney function diminished according to recent labs)   HPI: Luke Craig is a 56 y.o. male presenting on 12/26/2021 for New Patient (Initial Visit) (Kidney function diminished according to recent labs)   Pt presents today to establish care with new PCP. Has not seen a PCP in several years. He is a recovering heroin addict and goes to the methadone clinic on a regular basis. States they recently drew labs and said his renal function was abnormal and he needed to see a PCP. He reports a history of Bipolar disorder and schizophrenia, not on medications. States he would self medicate with street drugs and this is what led to his addiction. States he has been off of street drugs for over  a year and does not plan on relapsing. He reports going to the [facility] in the past for care, those records have been requested. States only medication he is on is methadone. He is a PPD smoker for over 35 years. Health maintenance is not up to date. Does not wish to address today. States he would just like to get his labs rechecked. Denies complaints or concerns.     Relevant past medical, surgical, family, and social history reviewed and updated as indicated.  Allergies and medications reviewed and updated. Data reviewed: Chart in Epic.   Past Medical History:  Diagnosis Date   Anxiety    Bipolar 1 disorder ([place])    Bronchitis    hx   COPD (chronic obstructive pulmonary disease) (HCC)    Depression    History of kidney stones    History of kidney stones    Intermittent explosive disorder    Open wound of right middle finger without damage to nail    Schizophrenia (HCC)    Shortness of breath dyspnea    increased exertion     Past Surgical History:   Procedure Laterality Date   BUNIONECTOMY Bilateral    CYSTOSCOPY W/ URETERAL STENT PLACEMENT Left 11/15/2015   Procedure: CYSTOSCOPY WITH RETROGRADE PYELOGRAM/URETERAL STENT PLACEMENT/URETEROSCOPY/STONE BASKET EXTRACTION;  Surgeon: Cleon Gustin, MD;  Location: [facility];  Service: Urology;  Laterality: Left;   CYSTOSCOPY WITH URETEROSCOPY AND STENT PLACEMENT Left 12/12/2015   Procedure: LEFT URETEROSCOPY STONE EXTRACTION AND STENT PLACEMENT;  Surgeon: Irine Seal, MD;  Location: [facility];  Service: Urology;  Laterality: Left;   CYSTOSCOPY/URETEROSCOPY/HOLMIUM LASER Left 11/15/2015   Procedure: CYSTOSCOPY/STONE EXTRACTION WITH HOLMIUM LASER;  Surgeon: Cleon Gustin, MD;  Location: [facility];  Service: Urology;  Laterality: Left;   HOLMIUM LASER APPLICATION Left 10/11/5186   Procedure: HOLMIUM LASER APPLICATION;  Surgeon: Irine Seal, MD;  Location: [facility];  Service: Urology;  Laterality: Left;   I & D EXTREMITY  10/30/2011   Procedure: IRRIGATION AND DEBRIDEMENT EXTREMITY;  Surgeon: Linna Hoff, MD;  Location: [facility];  Service: Orthopedics;  Laterality: Right;  Wound exploration right middle finger with repair   SHOULDER SURGERY Right    separated shoulder   VASECTOMY      Social History   Socioeconomic History   Marital status: Married    Spouse name: Not on file   Number of children: Not on file   Years of education: Not on file   Highest  education level: Not on file  Occupational History   Not on file  Tobacco Use   Smoking status: Every Day    Packs/day: 1.00    Years: 35.00    Total pack years: 35.00    Types: Cigarettes   Smokeless tobacco: Never  Substance and Sexual Activity   Alcohol use: No   Drug use: No   Sexual activity: Yes    Birth control/protection: Surgical  Other Topics Concern   Not on file  Social History Narrative   Not on file   Social Determinants of Health   Financial Resource Strain: Not on file  Food Insecurity: Not on file  Transportation  Needs: Not on file  Physical Activity: Not on file  Stress: Not on file  Social Connections: Not on file  Intimate Partner Violence: Not on file    Outpatient Encounter Medications as of 12/26/2021  Medication Sig   [DISCONTINUED] citalopram (CELEXA) 20 MG tablet Take 20 mg by mouth daily.   [DISCONTINUED] ALPRAZolam (XANAX) 0.5 MG tablet Take 0.5 mg by mouth 4 (four) times daily as needed for anxiety.    [DISCONTINUED] busPIRone (BUSPAR) 10 MG tablet Take 30 mg by mouth 2 (two) times daily.   [DISCONTINUED] clindamycin (CLEOCIN) 150 MG capsule Take 1 capsule (150 mg total) by mouth every 6 (six) hours.   [DISCONTINUED] cyclobenzaprine (FLEXERIL) 10 MG tablet Take 1 tablet (10 mg total) by mouth 3 (three) times daily as needed.   [DISCONTINUED] divalproex (DEPAKOTE ER) 500 MG 24 hr tablet Take 1,500 mg by mouth daily. At 5pm   [DISCONTINUED] HYDROcodone-acetaminophen (NORCO/VICODIN) 5-325 MG tablet Take one tab po q 4 hrs prn pain   [DISCONTINUED] predniSONE (DELTASONE) 10 MG tablet Take 6 tablets day one, 5 tablets day two, 4 tablets day three, 3 tablets day four, 2 tablets day five, then 1 tablet day six   [DISCONTINUED] promethazine (PHENERGAN) 12.5 MG tablet Take 1 tablet (12.5 mg total) by mouth every 6 (six) hours as needed for nausea or vomiting.   [DISCONTINUED] tamsulosin (FLOMAX) 0.4 MG CAPS capsule Take 1 capsule (0.4 mg total) by mouth at bedtime.   No facility-administered encounter medications on file as of 12/26/2021.    Allergies  Allergen Reactions   Aspirin Other (See Comments)    Upset stomach   Ibuprofen Other (See Comments)    Stomach upset   Tramadol Nausea And Vomiting    Review of Systems  Constitutional:  Negative for activity change, appetite change, chills, diaphoresis, fatigue, fever and unexpected weight change.  HENT: Negative.    Eyes: Negative.  Negative for photophobia and visual disturbance.  Respiratory:  Negative for cough, chest tightness and  shortness of breath.   Cardiovascular:  Negative for chest pain, palpitations and leg swelling.  Gastrointestinal:  Negative for blood in stool, constipation, diarrhea, nausea and vomiting.  Endocrine: Negative.   Genitourinary:  Negative for decreased urine volume, difficulty urinating, dysuria, enuresis, flank pain, frequency, genital sores, hematuria, penile discharge, penile pain, penile swelling, scrotal swelling, testicular pain and urgency.  Musculoskeletal:  Negative for arthralgias and myalgias.  Skin: Negative.   Allergic/Immunologic: Negative.   Neurological:  Negative for dizziness, tremors, seizures, syncope, facial asymmetry, speech difficulty, weakness, light-headedness, numbness and headaches.  Hematological: Negative.   Psychiatric/Behavioral:  Negative for confusion, hallucinations, sleep disturbance and suicidal ideas.   All other systems reviewed and are negative.       Objective:  BP 129/79   Pulse 60   Temp 98.4 F (  36.9 C)   Ht _0  (1.803 m)   Wt 155 lb (70.3 kg)   SpO2 95%   BMI 21.62 kg/m    Wt Readings from Last 3 Encounters:  12/26/21 155 lb (70.3 kg)  03/06/21 172 lb (78 kg)  09/05/18 165 lb (74.8 kg)    Physical Exam Vitals and nursing note reviewed.  Constitutional:      General: He is not in acute distress.    Appearance: Normal appearance. He is well-developed, well-groomed and normal weight. He is not ill-appearing, toxic-appearing or diaphoretic.  HENT:     Head: Normocephalic and atraumatic.     Jaw: There is normal jaw occlusion.     Right Ear: Hearing, tympanic membrane, ear canal and external ear normal.     Left Ear: Hearing, tympanic membrane, ear canal and external ear normal.     Nose: Nose normal.     Mouth/Throat:     Lips: Pink.     Mouth: Mucous membranes are moist.     Pharynx: Oropharynx is clear. Uvula midline.  Eyes:     General: Lids are normal.     Extraocular Movements: Extraocular movements intact.      Conjunctiva/sclera: Conjunctivae normal.     Pupils: Pupils are equal, round, and reactive to light.  Neck:     Thyroid: No thyroid mass, thyromegaly or thyroid tenderness.     Vascular: No carotid bruit or JVD.     Trachea: Trachea and phonation normal.  Cardiovascular:     Rate and Rhythm: Normal rate and regular rhythm.     Chest Wall: PMI is not displaced.     Pulses: Normal pulses.     Heart sounds: Normal heart sounds. No murmur heard.    No friction rub. No gallop.  Pulmonary:     Effort: Pulmonary effort is normal. No respiratory distress.     Breath sounds: Normal breath sounds. No wheezing.  Abdominal:     General: There is no abdominal bruit.     Palpations: Abdomen is soft. There is no hepatomegaly or splenomegaly.  Musculoskeletal:        General: Normal range of motion.     Cervical back: Normal range of motion and neck supple.     Right lower leg: No edema.     Left lower leg: No edema.  Lymphadenopathy:     Cervical: No cervical adenopathy.  Skin:    General: Skin is warm and dry.     Capillary Refill: Capillary refill takes less than 2 seconds.     Coloration: Skin is not cyanotic, jaundiced or pale.     Findings: No rash.  Neurological:     General: No focal deficit present.     Mental Status: He is alert and oriented to person, place, and time.     Sensory: Sensation is intact.     Motor: Motor function is intact.     Coordination: Coordination is intact.     Gait: Gait is intact.     Deep Tendon Reflexes: Reflexes are normal and symmetric.  Psychiatric:        Attention and Perception: Attention and perception normal.        Mood and Affect: Mood normal. Affect is flat.        Speech: Speech normal.        Behavior: Behavior normal. Behavior is cooperative.        Thought Content: Thought content normal.  Cognition and Memory: Cognition and memory normal.        Judgment: Judgment normal.     Results for orders placed or performed during the  hospital encounter of 12/12/15  Stone analysis  Result Value Ref Range   Composition Comment    Nidus No Nidus visualized    Stone Weight KSTONE 12.9 mg   Color Tan    Size 3x2x2 mm   Ca Oxalate,Dihydrate 10 %   Ca Oxalate,Monohydr. 80 %   Ca phos cry stone ql IR 10 %   Photo Comment    Comment: Comment    PLEASE NOTE: Comment   Basic metabolic panel  Result Value Ref Range   Sodium 137 135 - 145 mmol/L   Potassium 4.2 3.5 - 5.1 mmol/L   Chloride 106 101 - 111 mmol/L   CO2 22 22 - 32 mmol/L   Glucose, Bld 100 (H) 65 - 99 mg/dL   BUN 25 (H) 6 - 20 mg/dL   Creatinine, Ser 0.62 0.61 - 1.24 mg/dL   Calcium 9.4 8.9 - 10.3 mg/dL   GFR calc non Af Amer >60 >60 mL/min   GFR calc Af Amer >60 >60 mL/min   Anion gap 9 5 - 15  Hemoglobin  Result Value Ref Range   Hemoglobin 13.5 13.0 - 17.0 g/dL       Pertinent labs & imaging results that were available during my care of the patient were reviewed by me and considered in my medical decision making.  Assessment & Plan:  [place] was seen today for new patient (initial visit).  Diagnoses and all orders for this visit:  Encounter to establish care Prior medical records have been requested. Pt to make an appointment for a CPE in 1 month.   Abnormal renal function No complaints. Will recheck labs today and address as warranted.  -     CMP14+EGFR -     CBC with Differential/Platelet  History of substance abuse ([place]) Opioid dependence with current use ([place]) Followed by the methadone clinic.     Continue all other maintenance medications.  Follow up plan: Return in about 1 month (around 01/25/2022), or if symptoms worsen or fail to improve, for CPE with fasting labs.   Continue healthy lifestyle choices, including diet (rich in fruits, vegetables, and lean proteins, and low in salt and simple carbohydrates) and exercise (at least 30 minutes of moderate physical activity daily).   The above assessment and management plan was  discussed with the patient. The patient verbalized understanding of and has agreed to the management plan. Patient is aware to call the clinic if they develop any new symptoms or if symptoms persist or worsen. Patient is aware when to return to the clinic for a follow-up visit. Patient educated on when it is appropriate to go to the emergency department.   Monia Pouch, FNP-C [facility] [phone]

## 2021-12-27 LAB — CMP14+EGFR
ALT: 8 IU/L (ref 0–44)
AST: 16 IU/L (ref 0–40)
Albumin/Globulin Ratio: 1.5 (ref 1.2–2.2)
Albumin: 4 g/dL (ref 3.8–4.9)
Alkaline Phosphatase: 77 IU/L (ref 44–121)
BUN/Creatinine Ratio: 17 (ref 9–20)
BUN: 12 mg/dL (ref 6–24)
Bilirubin Total: <0.2 mg/dL (ref 0.0–1.2)
CO2: 23 mmol/L (ref 20–29)
Calcium: 8.9 mg/dL (ref 8.7–10.2)
Chloride: 102 mmol/L (ref 96–106)
Creatinine, Ser: 0.71 mg/dL — ABNORMAL LOW (ref 0.76–1.27)
Globulin, Total: 2.6 g/dL (ref 1.5–4.5)
Glucose: 89 mg/dL (ref 70–99)
Potassium: 3.6 mmol/L (ref 3.5–5.2)
Sodium: 140 mmol/L (ref 134–144)
Total Protein: 6.6 g/dL (ref 6.0–8.5)
eGFR: 108 mL/min/{1.73_m2} (ref >59)

## 2021-12-27 LAB — CBC WITH DIFFERENTIAL/PLATELET
Basophils Absolute: 0.1 10*3/uL (ref 0.0–0.2)
Basos: 1 %
EOS (ABSOLUTE): 0.1 10*3/uL (ref 0.0–0.4)
Eos: 1 %
Hematocrit: 34.6 % — ABNORMAL LOW (ref 37.5–51.0)
Hemoglobin: 11.3 g/dL — ABNORMAL LOW (ref 13.0–17.7)
Immature Grans (Abs): 0 10*3/uL (ref 0.0–0.1)
Immature Granulocytes: 0 %
Lymphocytes Absolute: 1.2 10*3/uL (ref 0.7–3.1)
Lymphs: 19 %
MCH: 27.6 pg (ref 26.6–33.0)
MCHC: 32.7 g/dL (ref 31.5–35.7)
MCV: 84 fL (ref 79–97)
Monocytes Absolute: 0.4 10*3/uL (ref 0.1–0.9)
Monocytes: 6 %
Neutrophils Absolute: 4.8 10*3/uL (ref 1.4–7.0)
Neutrophils: 73 %
Platelets: 244 10*3/uL (ref 150–450)
RBC: 4.1 x10E6/uL — ABNORMAL LOW (ref 4.14–5.80)
RDW: 14.7 % (ref 11.6–15.4)
WBC: 6.5 10*3/uL (ref 3.4–10.8)

## 2022-01-30 ENCOUNTER — Ambulatory Visit (INDEPENDENT_AMBULATORY_CARE_PROVIDER_SITE_OTHER): Payer: Self-pay | Admitting: Family Medicine

## 2022-01-30 ENCOUNTER — Encounter: Payer: Self-pay | Admitting: Family Medicine

## 2022-01-30 VITALS — BP 121/83 | HR 72 | Temp 98.8°F | Ht 71.0 in | Wt 157.0 lb

## 2022-01-30 DIAGNOSIS — M7021 Olecranon bursitis, right elbow: Secondary | ICD-10-CM

## 2022-01-30 DIAGNOSIS — Z0000 Encounter for general adult medical examination without abnormal findings: Secondary | ICD-10-CM

## 2022-01-30 DIAGNOSIS — Z125 Encounter for screening for malignant neoplasm of prostate: Secondary | ICD-10-CM

## 2022-01-30 DIAGNOSIS — Z1329 Encounter for screening for other suspected endocrine disorder: Secondary | ICD-10-CM

## 2022-01-30 DIAGNOSIS — Z0001 Encounter for general adult medical examination with abnormal findings: Secondary | ICD-10-CM

## 2022-01-30 DIAGNOSIS — Z13220 Encounter for screening for lipoid disorders: Secondary | ICD-10-CM

## 2022-01-30 DIAGNOSIS — Z5320 Procedure and treatment not carried out because of patient's decision for unspecified reasons: Secondary | ICD-10-CM

## 2022-01-30 MED ORDER — PREDNISONE 20 MG PO TABS: 40.0000 mg | ORAL_TABLET | Freq: Every day | ORAL | 0 refills | Status: AC

## 2022-01-30 NOTE — Progress Notes (Signed)
Subjective:  Patient ID: Luke Craig, male    DOB: 01/09/1966, 56 y.o.   MRN: [id number]  Patient Care Team: Baruch Gouty, FNP as PCP - General (Family Medicine)   Chief Complaint:  Annual Exam   HPI: Luke Craig is a 56 y.o. male presenting on 01/30/2022 for Annual Exam   Pt presents today for annual physical exam. He has a history of opioid dependence and is [facility]. He has a 35+ pack year smoking history. He has bipolar disorder and schizophrenia and is not on medications, does not wish to be. Declined referral to psychiatry. His biggest concern today is his right elbow. He hit it over 2 weeks ago causing swelling to posterior elbow which has improved greatly but is still present.    Arm Pain  The incident occurred more than 1 week ago. The incident occurred at home. The injury mechanism was a direct blow. The pain is present in the right elbow. The quality of the pain is described as burning and aching. The pain does not radiate. The pain is at a severity of 5/10. The pain is moderate. The pain has been Constant since the incident. Pertinent negatives include no chest pain, muscle weakness, numbness or tingling. The symptoms are aggravated by movement. He has tried nothing for the symptoms. The treatment provided no relief.    Relevant past medical, surgical, family, and social history reviewed and updated as indicated.  Allergies and medications reviewed and updated. Data reviewed: Chart in Epic.   Past Medical History:  Diagnosis Date   Anxiety    Bipolar 1 disorder ([place])    Bronchitis    hx   COPD (chronic obstructive pulmonary disease) (HCC)    Depression    History of kidney stones    History of kidney stones    Intermittent explosive disorder    Open wound of right middle finger without damage to nail    Schizophrenia ([facility])    Shortness of breath dyspnea    increased exertion     Past Surgical History:  Procedure Laterality  Date   BUNIONECTOMY Bilateral    CYSTOSCOPY W/ URETERAL STENT PLACEMENT Left 11/15/2015   Procedure: CYSTOSCOPY WITH RETROGRADE PYELOGRAM/URETERAL STENT PLACEMENT/URETEROSCOPY/STONE BASKET EXTRACTION;  Surgeon: Cleon Gustin, MD;  Location: [facility];  Service: Urology;  Laterality: Left;   CYSTOSCOPY WITH URETEROSCOPY AND STENT PLACEMENT Left 12/12/2015   Procedure: LEFT URETEROSCOPY STONE EXTRACTION AND STENT PLACEMENT;  Surgeon: Irine Seal, MD;  Location: [facility];  Service: Urology;  Laterality: Left;   CYSTOSCOPY/URETEROSCOPY/HOLMIUM LASER Left 11/15/2015   Procedure: CYSTOSCOPY/STONE EXTRACTION WITH HOLMIUM LASER;  Surgeon: Cleon Gustin, MD;  Location: [facility];  Service: Urology;  Laterality: Left;   HOLMIUM LASER APPLICATION Left 01/09/3661   Procedure: HOLMIUM LASER APPLICATION;  Surgeon: Irine Seal, MD;  Location: [facility];  Service: Urology;  Laterality: Left;   I & D EXTREMITY  10/30/2011   Procedure: IRRIGATION AND DEBRIDEMENT EXTREMITY;  Surgeon: Linna Hoff, MD;  Location: [facility];  Service: Orthopedics;  Laterality: Right;  Wound exploration right middle finger with repair   SHOULDER SURGERY Right    separated shoulder   VASECTOMY      Social History   Socioeconomic History   Marital status: Married    Spouse name: Not on file   Number of children: Not on file   Years of education: Not on file   Highest education level: Not on file  Occupational History   Not on file  Tobacco Use   Smoking status: Every Day    Packs/day: 1.00    Years: 35.00    Total pack years: 35.00    Types: Cigarettes   Smokeless tobacco: Never  Substance and Sexual Activity   Alcohol use: No   Drug use: No   Sexual activity: Yes    Birth control/protection: Surgical  Other Topics Concern   Not on file  Social History Narrative   Not on file   Social Determinants of Health   Financial Resource Strain: Not on file  Food Insecurity: Not on file  Transportation Needs: Not on file   Physical Activity: Not on file  Stress: Not on file  Social Connections: Not on file  Intimate Partner Violence: Not on file    Outpatient Encounter Medications as of 01/30/2022  Medication Sig   methadone (DOLOPHINE) 10 MG/ML solution Take 135 mg by mouth daily.   predniSONE (DELTASONE) 20 MG tablet Take 2 tablets (40 mg total) by mouth daily with breakfast for 5 days.   No facility-administered encounter medications on file as of 01/30/2022.    Allergies  Allergen Reactions   Aspirin Other (See Comments)    Upset stomach   Ibuprofen Other (See Comments)    Stomach upset   Tramadol Nausea And Vomiting    Review of Systems  Constitutional:  Negative for activity change, appetite change, chills, diaphoresis, fatigue, fever and unexpected weight change.  HENT: Negative.    Eyes: Negative.  Negative for photophobia and visual disturbance.  Respiratory:  Negative for cough, chest tightness and shortness of breath.   Cardiovascular:  Negative for chest pain, palpitations and leg swelling.  Gastrointestinal:  Negative for abdominal pain, blood in stool, constipation, diarrhea, nausea and vomiting.  Endocrine: Negative.   Genitourinary:  Negative for decreased urine volume, difficulty urinating, dysuria, frequency and urgency.  Musculoskeletal:  Positive for arthralgias and joint swelling. Negative for myalgias.  Skin: Negative.   Allergic/Immunologic: Negative.   Neurological:  Negative for dizziness, tingling, numbness and headaches.  Hematological: Negative.   Psychiatric/Behavioral:  Negative for confusion, hallucinations, sleep disturbance and suicidal ideas.   All other systems reviewed and are negative.       Objective:  BP 121/83   Pulse 72   Temp 98.8 F (37.1 C)   Ht 5' 11" (1.803 m)   Wt 157 lb (71.2 kg)   SpO2 95%   BMI 21.90 kg/m    Wt Readings from Last 3 Encounters:  01/30/22 157 lb (71.2 kg)  12/26/21 155 lb (70.3 kg)  03/06/21 172 lb (78 kg)     Physical Exam Vitals and nursing note reviewed.  Constitutional:      General: He is not in acute distress.    Appearance: Normal appearance. He is well-developed and normal weight. He is not ill-appearing, toxic-appearing or diaphoretic.  HENT:     Head: Normocephalic and atraumatic.     Jaw: There is normal jaw occlusion.     Right Ear: Hearing, tympanic membrane, ear canal and external ear normal.     Left Ear: Hearing, tympanic membrane, ear canal and external ear normal.     Nose: Nose normal.     Mouth/Throat:     Lips: Pink.     Mouth: Mucous membranes are moist.     Pharynx: Oropharynx is clear. Uvula midline.  Eyes:     General: Lids are normal.     Extraocular Movements: Extraocular movements  intact.     Conjunctiva/sclera: Conjunctivae normal.     Pupils: Pupils are equal, round, and reactive to light.  Neck:     Thyroid: No thyroid mass, thyromegaly or thyroid tenderness.     Vascular: No carotid bruit or JVD.     Trachea: Trachea and phonation normal.  Cardiovascular:     Rate and Rhythm: Normal rate and regular rhythm.     Chest Wall: PMI is not displaced.     Pulses: Normal pulses.     Heart sounds: Normal heart sounds. No murmur heard.    No friction rub. No gallop.  Pulmonary:     Effort: Pulmonary effort is normal. No respiratory distress.     Breath sounds: Normal breath sounds. No wheezing.  Abdominal:     General: Bowel sounds are normal. There is no distension or abdominal bruit.     Palpations: Abdomen is soft. There is no hepatomegaly, splenomegaly or mass.     Tenderness: There is no abdominal tenderness. There is no right CVA tenderness, left CVA tenderness, guarding or rebound.     Hernia: No hernia is present.  Musculoskeletal:        General: Normal range of motion.     Right upper arm: Normal.     Right elbow: Swelling present. No deformity, effusion or lacerations. Normal range of motion. Tenderness present in olecranon process. No radial  head, medial epicondyle or lateral epicondyle tenderness.     Right forearm: Normal.     Cervical back: Normal range of motion and neck supple.     Right lower leg: No edema.     Left lower leg: No edema.  Lymphadenopathy:     Cervical: No cervical adenopathy.  Skin:    General: Skin is warm and dry.     Capillary Refill: Capillary refill takes less than 2 seconds.     Coloration: Skin is not cyanotic, jaundiced or pale.     Findings: No rash.  Neurological:     General: No focal deficit present.     Mental Status: He is alert and oriented to person, place, and time.     GCS: GCS eye subscore is 4. GCS verbal subscore is 5. GCS motor subscore is 6.     Cranial Nerves: Cranial nerves 2-12 are intact.     Sensory: Sensation is intact.     Motor: Motor function is intact.     Coordination: Coordination is intact.     Gait: Gait is intact.     Deep Tendon Reflexes: Reflexes are normal and symmetric.  Psychiatric:        Attention and Perception: Attention and perception normal.        Mood and Affect: Mood normal. Affect is flat.        Speech: Speech normal.        Behavior: Behavior normal. Behavior is cooperative.        Thought Content: Thought content normal.        Cognition and Memory: Cognition and memory normal.        Judgment: Judgment normal.     Results for orders placed or performed in visit on 12/26/21  CMP14+EGFR  Result Value Ref Range   Glucose 89 70 - 99 mg/dL   BUN 12 6 - 24 mg/dL   Creatinine, Ser 0.71 (L) 0.76 - 1.27 mg/dL   eGFR 108 >59 mL/min/1.73   BUN/Creatinine Ratio 17 9 - 20   Sodium 140 134 - 144  mmol/L   Potassium 3.6 3.5 - 5.2 mmol/L   Chloride 102 96 - 106 mmol/L   CO2 23 20 - 29 mmol/L   Calcium 8.9 8.7 - 10.2 mg/dL   Total Protein 6.6 6.0 - 8.5 g/dL   Albumin 4.0 3.8 - 4.9 g/dL   Globulin, Total 2.6 1.5 - 4.5 g/dL   Albumin/Globulin Ratio 1.5 1.2 - 2.2   Bilirubin Total <0.2 0.0 - 1.2 mg/dL   Alkaline Phosphatase 77 44 - 121 IU/L    AST 16 0 - 40 IU/L   ALT 8 0 - 44 IU/L  CBC with Differential/Platelet  Result Value Ref Range   WBC 6.5 3.4 - 10.8 x10E3/uL   RBC 4.10 (L) 4.14 - 5.80 x10E6/uL   Hemoglobin 11.3 (L) 13.0 - 17.7 g/dL   Hematocrit 34.6 (L) 37.5 - 51.0 %   MCV 84 79 - 97 fL   MCH 27.6 26.6 - 33.0 pg   MCHC 32.7 31.5 - 35.7 g/dL   RDW 14.7 11.6 - 15.4 %   Platelets 244 150 - 450 x10E3/uL   Neutrophils 73 Not Estab. %   Lymphs 19 Not Estab. %   Monocytes 6 Not Estab. %   Eos 1 Not Estab. %   Basos 1 Not Estab. %   Neutrophils Absolute 4.8 1.4 - 7.0 x10E3/uL   Lymphocytes Absolute 1.2 0.7 - 3.1 x10E3/uL   Monocytes Absolute 0.4 0.1 - 0.9 x10E3/uL   EOS (ABSOLUTE) 0.1 0.0 - 0.4 x10E3/uL   Basophils Absolute 0.1 0.0 - 0.2 x10E3/uL   Immature Granulocytes 0 Not Estab. %   Immature Grans (Abs) 0.0 0.0 - 0.1 x10E3/uL       Pertinent labs & imaging results that were available during my care of the patient were reviewed by me and considered in my medical decision making.  Assessment & Plan:  Luke Craig was seen today for annual exam.  Diagnoses and all orders for this visit:  Annual physical exam Colon cancer screening declined Lung cancer screening declined by patient Health maintenance discussed in detail. Diet and exercise encouraged. Declined colorectal cancer screening and lung cancer screening.  -     CMP14+EGFR -     CBC with Differential/Platelet -     PSA, total and free -     Lipid panel -     Thyroid Panel With TSH  Screening for prostate cancer -     PSA, total and free  Screening for lipid disorders -     Lipid panel  Screening for endocrine disorder -     CMP14+EGFR -     Thyroid Panel With TSH  Olecranon bursitis of right elbow Declined to have bursa drained in office today. Will burst with steroids.  -     predniSONE (DELTASONE) 20 MG tablet; Take 2 tablets (40 mg total) by mouth daily with breakfast for 5 days.     Continue all other maintenance medications.  Follow up  plan: Return if symptoms worsen or fail to improve.   Continue healthy lifestyle choices, including diet (rich in fruits, vegetables, and lean proteins, and low in salt and simple carbohydrates) and exercise (at least 30 minutes of moderate physical activity daily).  Educational handout given for health maintenance  The above assessment and management plan was discussed with the patient. The patient verbalized understanding of and has agreed to the management plan. Patient is aware to call the clinic if they develop any new symptoms or if symptoms persist or worsen.  Patient is aware when to return to the clinic for a follow-up visit. Patient educated on when it is appropriate to go to the emergency department.   Monia Pouch, FNP-C [facility] [phone]

## 2022-01-31 LAB — CMP14+EGFR
ALT: 16 IU/L (ref 0–44)
AST: 21 IU/L (ref 0–40)
Albumin/Globulin Ratio: 1.4 (ref 1.2–2.2)
Albumin: 3.8 g/dL (ref 3.8–4.9)
Alkaline Phosphatase: 84 IU/L (ref 44–121)
BUN/Creatinine Ratio: 17 (ref 9–20)
BUN: 9 mg/dL (ref 6–24)
Bilirubin Total: <0.2 mg/dL (ref 0.0–1.2)
CO2: 22 mmol/L (ref 20–29)
Calcium: 9 mg/dL (ref 8.7–10.2)
Chloride: 100 mmol/L (ref 96–106)
Creatinine, Ser: 0.54 mg/dL — ABNORMAL LOW (ref 0.76–1.27)
Globulin, Total: 2.7 g/dL (ref 1.5–4.5)
Glucose: 99 mg/dL (ref 70–99)
Potassium: 4.4 mmol/L (ref 3.5–5.2)
Sodium: 134 mmol/L (ref 134–144)
Total Protein: 6.5 g/dL (ref 6.0–8.5)
eGFR: 117 mL/min/{1.73_m2} (ref >59)

## 2022-01-31 LAB — CBC WITH DIFFERENTIAL/PLATELET
Basophils Absolute: 0 10*3/uL (ref 0.0–0.2)
Basos: 1 %
EOS (ABSOLUTE): 0.1 10*3/uL (ref 0.0–0.4)
Eos: 1 %
Hematocrit: 37.9 % (ref 37.5–51.0)
Hemoglobin: 12.4 g/dL — ABNORMAL LOW (ref 13.0–17.7)
Immature Grans (Abs): 0 10*3/uL (ref 0.0–0.1)
Immature Granulocytes: 0 %
Lymphocytes Absolute: 0.9 10*3/uL (ref 0.7–3.1)
Lymphs: 13 %
MCH: 27.7 pg (ref 26.6–33.0)
MCHC: 32.7 g/dL (ref 31.5–35.7)
MCV: 85 fL (ref 79–97)
Monocytes Absolute: 0.4 10*3/uL (ref 0.1–0.9)
Monocytes: 6 %
Neutrophils Absolute: 5.9 10*3/uL (ref 1.4–7.0)
Neutrophils: 79 %
Platelets: 326 10*3/uL (ref 150–450)
RBC: 4.48 x10E6/uL (ref 4.14–5.80)
RDW: 14.9 % (ref 11.6–15.4)
WBC: 7.4 10*3/uL (ref 3.4–10.8)

## 2022-01-31 LAB — PSA, TOTAL AND FREE
PSA, Free Pct: 35 %
PSA, Free: 0.07 ng/mL
Prostate Specific Ag, Serum: 0.2 ng/mL (ref 0.0–4.0)

## 2022-01-31 LAB — LIPID PANEL
Chol/HDL Ratio: 3.7 ratio (ref 0.0–5.0)
Cholesterol, Total: 141 mg/dL (ref 100–199)
HDL: 38 mg/dL — ABNORMAL LOW (ref >39)
LDL Chol Calc (NIH): 81 mg/dL (ref 0–99)
Triglycerides: 120 mg/dL (ref 0–149)
VLDL Cholesterol Cal: 22 mg/dL (ref 5–40)

## 2022-01-31 LAB — THYROID PANEL WITH TSH
Free Thyroxine Index: 1.9 (ref 1.2–4.9)
T3 Uptake Ratio: 25 % (ref 24–39)
T4, Total: 7.4 ug/dL (ref 4.5–12.0)
TSH: 1.94 u[IU]/mL (ref 0.450–4.500)

## 2022-01-31 LAB — HIV ANTIBODY (ROUTINE TESTING W REFLEX): HIV Screen 4th Generation wRfx: NONREACTIVE

## 2022-01-31 LAB — HEPATITIS C ANTIBODY: Hep C Virus Ab: NONREACTIVE

## 2022-03-27 ENCOUNTER — Telehealth: Payer: Self-pay | Admitting: Family Medicine

## 2022-03-27 NOTE — Telephone Encounter (Signed)
PT WIFE AWARE THIS IS NOT ABLE

## 2023-04-04 DIAGNOSIS — Z885 Allergy status to narcotic agent status: Secondary | ICD-10-CM | POA: Diagnosis not present

## 2023-04-04 DIAGNOSIS — Z886 Allergy status to analgesic agent status: Secondary | ICD-10-CM | POA: Diagnosis not present

## 2023-04-04 DIAGNOSIS — S20211A Contusion of right front wall of thorax, initial encounter: Secondary | ICD-10-CM | POA: Diagnosis not present

## 2023-04-04 DIAGNOSIS — S39012A Strain of muscle, fascia and tendon of lower back, initial encounter: Secondary | ICD-10-CM | POA: Diagnosis not present

## 2023-04-04 DIAGNOSIS — S060X0A Concussion without loss of consciousness, initial encounter: Secondary | ICD-10-CM | POA: Diagnosis not present

## 2023-04-18 ENCOUNTER — Other Ambulatory Visit: Payer: Self-pay | Admitting: Family Medicine

## 2023-04-18 DIAGNOSIS — Z1211 Encounter for screening for malignant neoplasm of colon: Secondary | ICD-10-CM

## 2023-04-18 DIAGNOSIS — Z1212 Encounter for screening for malignant neoplasm of rectum: Secondary | ICD-10-CM

## 2023-08-06 DIAGNOSIS — H5213 Myopia, bilateral: Secondary | ICD-10-CM | POA: Diagnosis not present
